# Patient Record
Sex: Female | Born: 1970
Health system: Southern US, Community
[De-identification: ages and names within clinical notes are randomized; demographics above are authoritative.]

## PROBLEM LIST (undated history)

## (undated) DIAGNOSIS — F17200 Nicotine dependence, unspecified, uncomplicated: Secondary | ICD-10-CM

## (undated) DIAGNOSIS — G43909 Migraine, unspecified, not intractable, without status migrainosus: Secondary | ICD-10-CM

## (undated) DIAGNOSIS — Z9181 History of falling: Secondary | ICD-10-CM

## (undated) DIAGNOSIS — F32A Depression, unspecified: Secondary | ICD-10-CM

## (undated) DIAGNOSIS — F329 Major depressive disorder, single episode, unspecified: Secondary | ICD-10-CM

## (undated) DIAGNOSIS — F419 Anxiety disorder, unspecified: Secondary | ICD-10-CM

## (undated) DIAGNOSIS — G1221 Amyotrophic lateral sclerosis: Secondary | ICD-10-CM

## (undated) HISTORY — DX: Nicotine dependence, unspecified, uncomplicated: F17.200

## (undated) HISTORY — DX: Major depressive disorder, single episode, unspecified: F32.9

## (undated) HISTORY — DX: Migraine, unspecified, not intractable, without status migrainosus: G43.909

## (undated) HISTORY — DX: Anxiety disorder, unspecified: F41.9

## (undated) HISTORY — PX: TUBAL LIGATION: SHX77

## (undated) HISTORY — DX: Depression, unspecified: F32.A

## (undated) HISTORY — PX: ABDOMINAL HYSTERECTOMY: SHX81

---

## 1999-12-30 ENCOUNTER — Ambulatory Visit (HOSPITAL_BASED_OUTPATIENT_CLINIC_OR_DEPARTMENT_OTHER): Admission: RE | Admit: 1999-12-30 | Discharge: 1999-12-30 | Payer: Self-pay | Admitting: General Surgery

## 2004-06-04 ENCOUNTER — Encounter: Admission: RE | Admit: 2004-06-04 | Discharge: 2004-06-04 | Payer: Self-pay | Admitting: Family Medicine

## 2004-07-14 ENCOUNTER — Encounter: Admission: RE | Admit: 2004-07-14 | Discharge: 2004-07-14 | Payer: Self-pay | Admitting: Family Medicine

## 2006-05-14 ENCOUNTER — Emergency Department (HOSPITAL_COMMUNITY): Admission: EM | Admit: 2006-05-14 | Discharge: 2006-05-14 | Payer: Self-pay | Admitting: Emergency Medicine

## 2016-08-08 HISTORY — PX: LAPAROSCOPIC NEPHRECTOMY: SUR781

## 2016-08-29 DIAGNOSIS — Z524 Kidney donor: Secondary | ICD-10-CM | POA: Insufficient documentation

## 2016-09-28 DIAGNOSIS — Z905 Acquired absence of kidney: Secondary | ICD-10-CM | POA: Insufficient documentation

## 2018-02-17 ENCOUNTER — Telehealth: Payer: Self-pay | Admitting: Emergency Medicine

## 2018-02-17 ENCOUNTER — Encounter: Payer: Self-pay | Admitting: Emergency Medicine

## 2018-02-17 ENCOUNTER — Emergency Department
Admission: EM | Admit: 2018-02-17 | Discharge: 2018-02-17 | Disposition: A | Discharge status: Home / Self Care | Payer: Self-pay | Source: Home / Self Care

## 2018-02-17 ENCOUNTER — Emergency Department (INDEPENDENT_AMBULATORY_CARE_PROVIDER_SITE_OTHER): Payer: Self-pay

## 2018-02-17 DIAGNOSIS — S93601A Unspecified sprain of right foot, initial encounter: Secondary | ICD-10-CM

## 2018-02-17 DIAGNOSIS — M79671 Pain in right foot: Secondary | ICD-10-CM

## 2018-02-17 MED ORDER — TRAMADOL HCL 50 MG PO TABS: 50.0000 mg | ORAL_TABLET | Freq: Four times a day (QID) | ORAL | 0 refills | Status: DC | PRN

## 2018-02-17 NOTE — ED Triage Notes (Signed)
Pt c/o right foot pain, bruising and swelling after she fell down 2 stairs last night.

## 2018-02-17 NOTE — Discharge Instructions (Addendum)
Elevate foot and apply ice in 20-minute increments as tolerated.  I want you to remain non-weightbearing on the right foot follow-up with sports medicine for further evaluation.  I have prescribed you tramadol as needed for pain please take medication as directed and avoid alcohol or driving while taking medication.

## 2018-02-17 NOTE — ED Provider Notes (Addendum)
[facility]    CSN: [id number] Arrival date & time: 02/17/18  0914     History   Chief Complaint Chief Complaint  Patient presents with  . Foot Pain   HPI Melanie Hogan is a 47 y.o. female for evaluation of right foot swelling and bruising x1 day.  Sting reports that she was walking down a flight of stairs and tripped over the remaining 2 stairs following placing most of her body weight onto her right foot.  Her foot is gradually begin to swell and she has bruising of both ankles bilaterally and at the remaining 3 digits of her right foot.  She reports pain increases with standing improves with nonweightbearing activities. She has not taken any medication as she donated a kidney over a year ago and has been advised by nephrologist not to take any type of NSAIDs. She denies any numbness or paresthesias.  She also denies any prior injuries or surgeries to her right foot or ankle.  OB History   None     Home Medications    Prior to Admission medications   Medication Sig Start Date End Date Taking? Authorizing Provider  traMADol (ULTRAM) 50 MG tablet Take 1-2 tablets (50-100 mg total) by mouth every 6 (six) hours as needed. 02/17/18   Scot Jun, FNP    Family History History reviewed. No pertinent family history.  Social History Social History   Tobacco Use  . Smoking status: Current Every Day Smoker    Packs/day: 0.50    Types: Cigarettes  . Smokeless tobacco: Never Used  Substance Use Topics  . Alcohol use: Not on file  . Drug use: Not on file     Allergies   Patient has no allergy information on record.   Review of Systems Review of Systems Pertinent negatives listed in HPI  Physical Exam Triage Vit  No data found.  Updated Vital Signs BP 112/76 (BP Location: Right Arm)   Pulse (!) 108   Temp 98.2 F (36.8 C) (Oral)   Wt 138 lb (62.6 kg)   SpO2 100%   Visual Acuity Right Eye Distance:   Left Eye Distance:   Bilateral  Distance:    Right Eye Near:   Left Eye Near:    Bilateral Near:     Physical Exam  Constitutional: She appears well-developed and well-nourished. No distress.  Cardiovascular: Normal rate and regular rhythm.  Pulmonary/Chest: Effort normal and breath sounds normal.  Musculoskeletal:       Left foot: There is tenderness, bony tenderness and swelling. There is normal capillary refill.       Feet:   UC Treatments / Results  Labs (all labs ordered are listed, but only abnormal results are displayed) Labs Reviewed - No data to display  EKG None  Radiology Dg Foot Complete Right  Result Date: 02/17/2018 CLINICAL DATA:  Pain after fall. EXAM: RIGHT FOOT COMPLETE - 3+ VIEW COMPARISON:  None. FINDINGS: There is no evidence of fracture or dislocation. There is no evidence of arthropathy or other focal bone abnormality. Soft tissues are unremarkable. IMPRESSION: Negative. Electronically Signed   By: Dorise Bullion III M.D   On: 02/17/2018 10:11    Procedures Procedures (including critical care time)  Medications Ordered in UC Medications - No data to display  Initial Impression / Assessment and Plan / UC Course  I have reviewed the triage vital signs and the nursing notes. Pertinent labs & imaging results that were available during my  care of the patient were reviewed by me and considered in my medical decision making (see chart for details).  Patient presents today with suspected foot fracture after sustaining a fall less than 24 hours ago.  On exam her feet was significant for bruising below the third fourth and fifth digits of the right foot.  She also has bruising and swelling laterally and medial laterally of the right foot.  Imaging was negative of any fracture.  Patient has significant edema of the right foot today which at times can cause fractures to be unseen on x-ray.  Today I will place patient in a postop shoe today opposed to [facility] Walker as she is uninsured and paying  out-of-pocket to conserve expense.  Ace bandage applied to right foot to facilitate compression to reduce swelling. Recommended to ice and foot also to reduce swelling. I am avoiding NSAIDs as patient has recently had a nephrectomy is advised to avoid renal toxic medications.  Have prescribed tramadol for pain relief.  She is to follow-up with [facility] medicine on 02/19/2018 to be further evaluated for foot sprain. Final Clinical Impressions(s) / UC Diagnoses   Final diagnoses:  Sprain of right foot, initial encounter    Discharge Instructions     Elevate foot and apply ice in 20-minute increments as tolerated.  I want you to remain non-weightbearing on the right foot follow-up with sports medicine for further evaluation.  I have prescribed you tramadol as needed for pain please take medication as directed and avoid alcohol or driving while taking medication.    ED Prescriptions    Medication Sig Dispense Auth. Provider   traMADol (ULTRAM) 50 MG tablet Take 1-2 tablets (50-100 mg total) by mouth every 6 (six) hours as needed. 18 tablet Scot Jun, FNP     Controlled Substance Prescriptions [place] Controlled Substance Registry consulted? Yes, I have consulted the [place] Controlled Substances Registry for this patient, and feel the risk/benefit ratio today is favorable for proceeding with this prescription for a controlled substance.   Scot Jun, FNP 02/17/18 [place], [place], FNP 02/17/18 [place], FNP 02/17/18 [phone]

## 2018-02-21 ENCOUNTER — Encounter: Payer: Self-pay | Admitting: Emergency Medicine

## 2018-02-21 ENCOUNTER — Emergency Department (INDEPENDENT_AMBULATORY_CARE_PROVIDER_SITE_OTHER)
Admission: EM | Admit: 2018-02-21 | Discharge: 2018-02-21 | Disposition: A | Discharge status: Home / Self Care | Payer: Self-pay | Source: Home / Self Care | Attending: Family Medicine | Admitting: Family Medicine

## 2018-02-21 ENCOUNTER — Emergency Department (INDEPENDENT_AMBULATORY_CARE_PROVIDER_SITE_OTHER): Payer: Self-pay

## 2018-02-21 ENCOUNTER — Other Ambulatory Visit: Payer: Self-pay

## 2018-02-21 DIAGNOSIS — S92301D Fracture of unspecified metatarsal bone(s), right foot, subsequent encounter for fracture with routine healing: Secondary | ICD-10-CM

## 2018-02-21 DIAGNOSIS — S93334D Other dislocation of right foot, subsequent encounter: Secondary | ICD-10-CM

## 2018-02-21 DIAGNOSIS — X58XXXD Exposure to other specified factors, subsequent encounter: Secondary | ICD-10-CM

## 2018-02-21 MED ORDER — TRAMADOL HCL 50 MG PO TABS: 50.0000 mg | ORAL_TABLET | Freq: Four times a day (QID) | ORAL | 0 refills | Status: DC | PRN

## 2018-02-21 MED ORDER — TRAMADOL HCL 50 MG PO TABS: 50.0000 mg | ORAL_TABLET | Freq: Three times a day (TID) | ORAL | 0 refills | Status: DC | PRN

## 2018-02-21 NOTE — Discharge Instructions (Signed)
°  Tramadol is strong pain medication and is considered a controlled substance.  It can become addictive.  Do not take more than prescribed and do not take more than needed. While taking, do not drink alcohol, drive, or perform any other activities that requires focus while taking these medications.   Please call to schedule a follow up appointment with Sports Medicine in 1-2 weeks if not improving to ensure proper healing of the fracture.

## 2018-02-21 NOTE — ED Triage Notes (Signed)
Pt presents for a follow up on the right foot. She is wearing the walking shoe, elevating and icing with some relief. She states she ran out of pain medications, last night was her last dose.

## 2018-02-21 NOTE — ED Provider Notes (Signed)
[facility]    CSN: [id number] Arrival date & time: 02/21/18  0803     History   Chief Complaint Chief Complaint  Patient presents with  . Foot Pain    Right foot recheck    HPI Melanie Hogan is a 47 y.o. female.   HPI  Melanie Hogan is a 47 y.o. female presenting to UC with c/o continued Right ankle and foot pain that started initially on 02/17/18 after tripping on some stairs.  She was seen at [facility] at that time and had x-rays, which were negative for fracture. She was given a post-op shoe had has been applying ice, ace wrap, and keeping elevated but pain is still present. Pain was aching and sore, 6/10 last night but only 4/10 at this time.  She ran out of her tramadol and is requesting more. She was advised to f/u once the swelling had resolved to make sure there was no occult fracture. No new injuries.  She cannot take NSAIDs due to donating a kidney in 2018 and had been advised by a neurologist 15 years ago to never take acetaminophen due to rebound headaches.    History reviewed. No pertinent past medical history.  There are no active problems to display for this patient.   History reviewed. No pertinent surgical history.  OB History   None      Home Medications    Prior to Admission medications   Medication Sig Start Date End Date Taking? Authorizing Provider  traMADol (ULTRAM) 50 MG tablet Take 1 tablet (50 mg total) by mouth every 8 (eight) hours as needed for moderate pain or severe pain. 02/21/18   Noe Gens, PA-C    Family History History reviewed. No pertinent family history.  Social History Social History   Tobacco Use  . Smoking status: Current Every Day Smoker    Packs/day: 0.50    Types: Cigarettes  . Smokeless tobacco: Never Used  Substance Use Topics  . Alcohol use: Not Currently  . Drug use: Not Currently     Allergies   Patient has no allergy information on record.   Review of Systems Review of Systems    Musculoskeletal: Positive for arthralgias, gait problem and myalgias. Negative for joint swelling.  Skin: Positive for color change. Negative for wound.     Physical Exam Triage Vital Signs ED Triage Vitals  Enc Vitals Group     BP 02/21/18 0812 112/77     Pulse Rate 02/21/18 0812 93     Resp --      Temp 02/21/18 0812 98.1 F (36.7 C)     Temp Source 02/21/18 0812 Oral     SpO2 02/21/18 0812 99 %     Weight --      Height --      Head Circumference --      Peak Flow --      Pain Score 02/21/18 0813 4     Pain Loc --      Pain Edu? --      Excl. in [place]? --    No data found.  Updated Vital Signs BP 112/77   Pulse 93   Temp 98.1 F (36.7 C) (Oral)   SpO2 99%   Visual Acuity Right Eye Distance:   Left Eye Distance:   Bilateral Distance:    Right Eye Near:   Left Eye Near:    Bilateral Near:     Physical Exam  Constitutional: She is  oriented to person, place, and time. She appears well-developed and well-nourished. No distress.  HENT:  Head: Normocephalic and atraumatic.  Eyes: EOM are normal.  Neck: Normal range of motion.  Cardiovascular: Normal rate.  Pulmonary/Chest: Effort normal.  Musculoskeletal: She exhibits edema and tenderness.  Right ankle and foot: minimal edema. Slight decreased dorsiflexion due to pain. Mild tenderness to medial aspect proximal foot. No crepitus. Full ROM toes, non-tender Calf is soft, non-tender.  Neurological: She is alert and oriented to person, place, and time.  Skin: Skin is warm and dry. She is not diaphoretic.  Right ankle and foot: skin in tact. Diffuse ecchymosis to ankle and toes. No erythema or warmth.  Psychiatric: She has a normal mood and affect. Her behavior is normal.  Nursing note and vitals reviewed.    UC Treatments / Results  Labs (all labs ordered are listed, but only abnormal results are displayed) Labs Reviewed - No data to display  EKG None  Radiology Dg Foot Complete Right  Result Date:  02/21/2018 CLINICAL DATA:  Recent inversion injury with swelling EXAM: RIGHT FOOT COMPLETE - 3+ VIEW COMPARISON:  February 17, 2018 FINDINGS: Frontal, oblique, and lateral views were obtained. There is a small avulsion arising from the dorsal proximal navicular. No other evident fracture. No dislocation. Joint spaces appear normal. No erosive change. IMPRESSION: Small avulsion arising from the dorsal proximal navicular. No other evident fracture. No dislocation. No appreciable arthropathy. These results will be called to the ordering clinician or representative by the Radiologist Assistant, and communication documented in the PACS or zVision Dashboard. Electronically Signed   By: Lowella Grip III M.D.   On: 02/21/2018 08:56    Procedures Procedures (including critical care time)  Medications Ordered in UC Medications - No data to display  Initial Impression / Assessment and Plan / UC Course  I have reviewed the triage vital signs and the nursing notes.  Pertinent labs & imaging results that were available during my care of the patient were reviewed by me and considered in my medical decision making (see chart for details).     Imaging shows avulsion fracture of dorsal proximal navicular. Although not stated on her last x-ray on 02/17/18, same bone abnormality can be seen there too. Pt may continue post-op shoe, ace wrap, elevate and tramadol  Final Clinical Impressions(s) / UC Diagnoses   Final diagnoses:  Closed avulsion fracture of metatarsal bone of right foot with routine healing, subsequent encounter     Discharge Instructions      Tramadol is strong pain medication and is considered a controlled substance.  It can become addictive.  Do not take more than prescribed and do not take more than needed. While taking, do not drink alcohol, drive, or perform any other activities that requires focus while taking these medications.   Please call to schedule a follow up appointment with  Sports Medicine in 1-2 weeks if not improving to ensure proper healing of the fracture.     ED Prescriptions    Medication Sig Dispense Auth. Provider   traMADol (ULTRAM) 50 MG tablet  (Status: Discontinued) Take 1 tablet (50 mg total) by mouth every 6 (six) hours as needed for moderate pain or severe pain. 10 tablet Leeroy Cha O, PA-C   traMADol (ULTRAM) 50 MG tablet Take 1 tablet (50 mg total) by mouth every 8 (eight) hours as needed for moderate pain or severe pain. 10 tablet Noe Gens, PA-C     Controlled Substance Prescriptions  [place] Controlled Substance Registry consulted? Yes, I have consulted the [place] Controlled Substances Registry for this patient, and feel the risk/benefit ratio today is favorable for proceeding with this prescription for a controlled substance.   Noe Gens, [place] 02/21/18 1029

## 2018-08-27 DIAGNOSIS — M79641 Pain in right hand: Secondary | ICD-10-CM | POA: Diagnosis not present

## 2018-09-05 ENCOUNTER — Ambulatory Visit (INDEPENDENT_AMBULATORY_CARE_PROVIDER_SITE_OTHER): Payer: BLUE CROSS/BLUE SHIELD | Admitting: Physician Assistant

## 2018-09-05 ENCOUNTER — Encounter: Payer: Self-pay | Admitting: Physician Assistant

## 2018-09-05 VITALS — BP 115/79 | HR 72 | Ht 67.0 in | Wt 139.0 lb

## 2018-09-05 DIAGNOSIS — Z7689 Persons encountering health services in other specified circumstances: Secondary | ICD-10-CM

## 2018-09-05 DIAGNOSIS — N289 Disorder of kidney and ureter, unspecified: Secondary | ICD-10-CM

## 2018-09-05 DIAGNOSIS — Z905 Acquired absence of kidney: Secondary | ICD-10-CM | POA: Diagnosis not present

## 2018-09-05 DIAGNOSIS — F17200 Nicotine dependence, unspecified, uncomplicated: Secondary | ICD-10-CM

## 2018-09-05 DIAGNOSIS — F411 Generalized anxiety disorder: Secondary | ICD-10-CM | POA: Insufficient documentation

## 2018-09-05 DIAGNOSIS — Z1231 Encounter for screening mammogram for malignant neoplasm of breast: Secondary | ICD-10-CM | POA: Diagnosis not present

## 2018-09-05 DIAGNOSIS — F331 Major depressive disorder, recurrent, moderate: Secondary | ICD-10-CM | POA: Diagnosis not present

## 2018-09-05 DIAGNOSIS — G43009 Migraine without aura, not intractable, without status migrainosus: Secondary | ICD-10-CM

## 2018-09-05 MED ORDER — TRAZODONE HCL 50 MG PO TABS: 25.0000 mg | ORAL_TABLET | Freq: Every evening | ORAL | 2 refills | Status: DC | PRN

## 2018-09-05 MED ORDER — RIZATRIPTAN BENZOATE 10 MG PO TBDP: 10.0000 mg | ORAL_TABLET | ORAL | 0 refills | Status: DC | PRN

## 2018-09-05 MED ORDER — ESCITALOPRAM OXALATE 20 MG PO TABS
ORAL_TABLET | ORAL | 1 refills | Status: DC
Start: 2018-09-05 — End: 2018-10-03

## 2018-09-05 MED ORDER — ALPRAZOLAM 0.5 MG PO TABS: 0.5000 mg | ORAL_TABLET | Freq: Once | ORAL | 0 refills | Status: DC | PRN

## 2018-09-05 NOTE — Progress Notes (Signed)
HPI:                                                                Melanie Hogan is a 48 y.o. female who presents to [place]: Primary Care Sports Medicine today to establish care  Current concerns:  S/p laparoscopic left donor nephrectomy Jan 2018. Last Scr 1.13, GFR 59 in Nov 2018. She has a follow-up appointment with nephrology at [facility] on Monday.  She states she is overdue for her annual follow-up.  Depression/Anxiety: reports feeling very overwhelmed with multiple stressors.  She recently found out from her employer that she may be laid off on Friday.  Lives at home with great nephew who is 61 years old, who has behavioral issues.  She is his primary legal guardian. She is experiencing headaches, loss of appetite, weight loss, panic attacks.  States "I feel empty.  I have nothing else to give."  She wants to feel better and provide for her nephew. Denies symptoms of mania/hypomania. Denies suicidal thinking. Denies auditory/visual hallucinations. Prior medications included Lexapro, Xanax and Trazodone Denies illicit drug or alcohol use.  Smokes approximately 1/2 pack/day.  Declined smoking cessation.  Headaches: reports 3 headache days last week and 1 severe headache this week.  Headaches are not as severe as her typical migraine headache.  Headache is described as throbbing and worse with position changes.  She has a history of migraine headaches and was evaluated by a neurologist 15 years ago.  History of normal CT head with and without contrast in 2005. She is not currently taking anything for her headaches due to fear of damaging her kidneys  Depression screen [facility] 2/9 09/05/2018  Decreased Interest 2  Down, Depressed, Hopeless 3  PHQ - 2 Score 5  Altered sleeping 3  Tired, decreased energy 3  Change in appetite 3  Feeling bad or failure about yourself  2  Trouble concentrating 2  Moving slowly or fidgety/restless 1  Suicidal thoughts 0  PHQ-9  Score 19    GAD 7 : Generalized Anxiety Score 09/05/2018  Nervous, Anxious, on Edge 3  Control/stop worrying 3  Worry too much - different things 3  Trouble relaxing 3  Restless 3  Easily annoyed or irritable 3  Afraid - awful might happen 3  Total GAD 7 Score 21      Past Medical History:  Diagnosis Date  . Anxiety   . Depression   . Tobacco use disorder    Past Surgical History:  Procedure Laterality Date  . ABDOMINAL HYSTERECTOMY    . LAPAROSCOPIC NEPHRECTOMY Left 08/2016   donor  . TUBAL LIGATION     Social History   Tobacco Use  . Smoking status: Current Every Day Smoker    Packs/day: 0.50    Years: 20.00    Pack years: 10.00    Types: Cigarettes  . Smokeless tobacco: Never Used  Substance Use Topics  . Alcohol use: Not Currently    Comment: 1-2 / year   family history is not on file.    ROS: Review of Systems  Constitutional:       + decreased appetite  Psychiatric/Behavioral: Positive for depression. Negative for substance abuse and suicidal ideas. The patient is nervous/anxious and has insomnia.  All other systems reviewed and are negative.    Medications: No current outpatient medications on file.   No current facility-administered medications for this visit.    Allergies  Allergen Reactions  . Morphine And Related        Objective:  BP 115/79   Pulse 72   Ht 5\' 7"  (1.702 m)   Wt 139 lb (63 kg)   BMI 21.77 kg/m  Gen:  alert, not ill-appearing, no distress, appropriate for age 50: head normocephalic without obvious abnormality, conjunctiva and cornea clear, wearing glasses trachea midline Pulm: Normal work of breathing, normal phonation Neuro: alert and oriented x 3, no tremor MSK: extremities atraumatic, normal gait and station Skin: intact, no rashes on exposed skin, no jaundice, no cyanosis Psych: well-groomed, cooperative, good eye contact, depressed mood, affect mood-congruent, she is tearful, speech is articulate, and  thought processes clear and goal-directed, good insight, normal judgment, no suicidal ideation    No results found for this or any previous visit (from the past 72 hour(s)). No results found.    Assessment and Plan: 48 y.o. female with   .Melanie Hogan was seen today for establish care.  Diagnoses and all orders for this visit:  Encounter to establish care  Breast cancer screening by mammogram -     MM 3D SCREEN BREAST BILATERAL; Future  GAD (generalized anxiety disorder) -     escitalopram (LEXAPRO) 20 MG tablet; One half tab daily for a week then one tab by mouth daily -     traZODone (DESYREL) 50 MG tablet; Take 0.5-1 tablets (25-50 mg total) by mouth at bedtime as needed for sleep. -     ALPRAZolam (XANAX) 0.5 MG tablet; Take 1 tablet (0.5 mg total) by mouth once as needed for up to 1 dose for anxiety (panic).  Moderate episode of recurrent major depressive disorder (HCC) -     escitalopram (LEXAPRO) 20 MG tablet; One half tab daily for a week then one tab by mouth daily  Tobacco use disorder  Migraine without aura and without status migrainosus, not intractable -     rizatriptan (MAXALT-MLT) 10 MG disintegrating tablet; Take 1 tablet (10 mg total) by mouth as needed for migraine. May repeat in 2 hours if needed  S/p nephrectomy -     Renal Profile with Estimated GFR -     CBC -     Urinalysis, Routine w reflex microscopic -     Urine Microalbumin w/creat. ratio  Renal insufficiency -     Renal Profile with Estimated GFR -     CBC -     Urinalysis, Routine w reflex microscopic -     Urine Microalbumin w/creat. ratio   - Personally reviewed PMH, PSH, PFH, medications, allergies, HM - Age-appropriate cancer screening: Overdue for Pap smear and mammogram: Mammogram ordered today, she will need to schedule annual physical exam with Pap smear - Influenza, Pneumovax and Tdap declined  It has been over a year since her kidney function was monitored.  She may lose her  insurance as early as this Friday so I will order all of her labs for her nephrologist today.   Major depression, generalized anxiety disorder, acute stress reaction PHQ 9 equals 19, no acute safety issues Gad 7 equals 21, severe Restarting Lexapro self titrate to 20 mg daily Restart trazodone 25 mg nightly, may increase to 50 mg nightly after 3 nights if needed Restart alprazolam half a milligram once daily as needed for severe anxiety and  panic attacks We discussed that there is no renal dosing as far as trazodone or alprazolam are concerned, but we should keep a close eye on her kidney function while she is taking long-term medications at least every 3 months in the beginning and every 6 months thereafter Provided with resources on local and online counseling options Follow-up in 1 month  Migraine headaches No change from prior headache pattern, but increased frequency in the setting of increased stress and anxiety Avoiding NSAIDs due to renal insufficiency Trial of Maxalt 10 mg as needed Headache diary Patient counseled on general measures for headache//migraine.  Declined smoking cessation  Patient education and anticipatory guidance given Patient agrees with treatment plan Follow-up in 1 month or sooner as needed if symptoms worsen or fail to improve  Darlyne Russian PA-C

## 2018-09-05 NOTE — Patient Instructions (Addendum)
For your migraines: -Keep track of your headaches with a headache diary.  Log the dates of your headache, how long they lasted, severity and what you took and if it was effective - take Drucilla Schmidt triptan dissolved under the tongue at the first sign of migraine for abortive therapy - may repeat once after 2 hours if headache persists - do not use more than twice in 24 hours. And try not to use abortive medication more than 6 times per month -Aim to get at least 7 to 9 hours of sleep per night - drink at least 64 ounces of water per day -Avoid all caffeine or at least limit to one standard caffeinated beverage per day - avoid alcohol  For anxiety and depression: Counseling: - psychologytoday.com: search engine to locate local counselors - Family Services in your county offer counseling on a sliding scale (pay what you can afford) - [place]: we can place a referral for you to see one of licensed counselors in [place], [place], or [place] - online counseling: Chartered loss adjuster (not covered by insurance, but affordable self-pay rates)  Other resources: Best Overall for Online Counseling: Talkspace "Talkspace's unlimited messaging and video conferencing makes it a convenient app for addressing a variety of needs."  Best Live Chat Sessions: Betterhelp "Betterhelp allows a variety of ways to contact your therapist, including live chat sessions."  Best for Couples: [facility] "[facility] specializes in couples therapy and they offer a variety of services to help address relationship issues."  Best for a Quick Consultation: Amwell "Amwell offers access to a variety of mental health professionals any time of day or night."  Best for Peer Support: 7 Cups of Tea "You can access free support from peers on 7 Cups of Tea or you can choose to pay to speak to a professional."  Best for Teens: Teen Counseling "Teens can chat, message, speak over the phone, or video  conference with a therapist who has experience treating their age group."  Best for LGBTQ: [facility] "[facility] offers online therapy to individuals in the [place] community, and their goal is to offer discreet, affordable, and accessible treatment."  Best Free Assessment: Doctor on Demand "Doctor on Demand provides access to a free assessment that can help you determine if you should talk to someone about anxiety or depression."  Best for Access to a Psychiatrist: MDL[facility] "MDLive provides access to psychiatrists who can prescribe and manage psychiatric medications."  Safety Plan: if having self-harm or suicidal thoughts Our Office [place] 1-800-SUICIDE If in immediate danger of harming yourself, go to the nearest emergency room or call 911

## 2018-09-06 LAB — RENAL PROFILE WITH ESTIMATED GFR
Albumin: 4.5 g/dL (ref 3.6–5.1)
BUN/Creatinine Ratio: 11 (calc) (ref 6–22)
BUN: 12 mg/dL (ref 7–25)
CO2: 25 mmol/L (ref 20–32)
Calcium: 9.7 mg/dL (ref 8.6–10.2)
Chloride: 107 mmol/L (ref 98–110)
Creat: 1.11 mg/dL — ABNORMAL HIGH (ref 0.50–1.10)
GFR, Est African American: 69 mL/min/{1.73_m2} (ref >=60)
GFR, Est Non African American: 60 mL/min/{1.73_m2} (ref >=60)
Glucose, Bld: 84 mg/dL (ref 65–99)
Phosphorus: 3.6 mg/dL (ref 2.5–4.5)
Potassium: 4.6 mmol/L (ref 3.5–5.3)
Sodium: 140 mmol/L (ref 135–146)

## 2018-09-06 LAB — CBC
HCT: 41.3 % (ref 35.0–45.0)
Hemoglobin: 14 g/dL (ref 11.7–15.5)
MCH: 30.8 pg (ref 27.0–33.0)
MCHC: 33.9 g/dL (ref 32.0–36.0)
MCV: 91 fL (ref 80.0–100.0)
MPV: 11.8 fL (ref 7.5–12.5)
Platelets: 258 10*3/uL (ref 140–400)
RBC: 4.54 10*6/uL (ref 3.80–5.10)
RDW: 12.8 % (ref 11.0–15.0)
WBC: 7.9 10*3/uL (ref 3.8–10.8)

## 2018-09-06 LAB — MICROALBUMIN / CREATININE URINE RATIO
Creatinine, Urine: 44 mg/dL (ref 20–275)
Microalb, Ur: <0.2 mg/dL

## 2018-09-06 LAB — URINALYSIS, ROUTINE W REFLEX MICROSCOPIC
Bilirubin Urine: NEGATIVE
Glucose, UA: NEGATIVE
Hgb urine dipstick: NEGATIVE
Ketones, ur: NEGATIVE
Leukocytes, UA: NEGATIVE
Nitrite: NEGATIVE
Protein, ur: NEGATIVE
Specific Gravity, Urine: 1.007 (ref 1.001–1.03)
pH: 5.5 (ref 5.0–8.0)

## 2018-09-07 NOTE — Progress Notes (Signed)
Labs show mild renal insufficiency.  This is stable compared to kidney function completed postoperatively at [facility] to continue current medications Continue to use caution with over-the-counter medicines and avoid NSAIDs We will route these lab results to nephrologist at [facility]

## 2018-10-03 ENCOUNTER — Ambulatory Visit (INDEPENDENT_AMBULATORY_CARE_PROVIDER_SITE_OTHER): Payer: BLUE CROSS/BLUE SHIELD | Admitting: Physician Assistant

## 2018-10-03 ENCOUNTER — Ambulatory Visit (INDEPENDENT_AMBULATORY_CARE_PROVIDER_SITE_OTHER): Payer: BLUE CROSS/BLUE SHIELD

## 2018-10-03 ENCOUNTER — Encounter: Payer: Self-pay | Admitting: Physician Assistant

## 2018-10-03 DIAGNOSIS — Z1231 Encounter for screening mammogram for malignant neoplasm of breast: Secondary | ICD-10-CM

## 2018-10-03 DIAGNOSIS — F331 Major depressive disorder, recurrent, moderate: Secondary | ICD-10-CM

## 2018-10-03 DIAGNOSIS — R928 Other abnormal and inconclusive findings on diagnostic imaging of breast: Secondary | ICD-10-CM | POA: Diagnosis not present

## 2018-10-03 DIAGNOSIS — F411 Generalized anxiety disorder: Secondary | ICD-10-CM | POA: Diagnosis not present

## 2018-10-03 MED ORDER — ALPRAZOLAM 0.5 MG PO TABS: 0.5000 mg | ORAL_TABLET | Freq: Once | ORAL | 1 refills | Status: DC | PRN

## 2018-10-03 MED ORDER — ESCITALOPRAM OXALATE 20 MG PO TABS: 20.0000 mg | ORAL_TABLET | Freq: Every day | ORAL | 0 refills | Status: DC

## 2018-10-03 MED ORDER — TRAZODONE HCL 100 MG PO TABS: 100.0000 mg | ORAL_TABLET | Freq: Every evening | ORAL | 0 refills | Status: DC | PRN

## 2018-10-03 NOTE — Progress Notes (Signed)
HPI:                                                                Melanie Hogan is a 48 y.o. female who presents to [facility] today to establish care  Current concerns:  Depression/Anxiety: reports feeling very overwhelmed with multiple stressors.  She recently found out from her employer that she may be laid off on Friday.  Lives at home with great nephew who is 25 years old, who has behavioral issues.  She is his primary legal guardian. She is experiencing headaches, loss of appetite, weight loss, panic attacks.  States "I feel empty.  I have nothing else to give."  She wants to feel better and provide for her nephew. Denies symptoms of mania/hypomania. Denies suicidal thinking. Denies auditory/visual hallucinations. Prior medications included Lexapro, Xanax and Trazodone Denies illicit drug or alcohol use.  Smokes approximately 1/2 pack/day.  Declined smoking cessation.  Headaches: reports 3 headache days last week and 1 severe headache this week.  Headaches are not as severe as her typical migraine headache.  Headache is described as throbbing and worse with position changes.  She has a history of migraine headaches and was evaluated by a neurologist 15 years ago.  History of normal CT head with and without contrast in 2005. She is not currently taking anything for her headaches due to fear of damaging her kidneys  Interval history 10/03/18 - feeling significantly improved since last OV. States "I'm 150 degrees better, not quite 180." - mood is good - sleep: restorative, taking Trazodone 100 mg QHS - has only had 1 mild headache since last month and did not need to take Maxalt for it - denies weight loss, appetite is improved - she does report stressors are still present; she is being laid off this Friday and had to put her dog down on 09/21/18  Depression screen [facility] 2/9 10/03/2018 09/05/2018  Decreased Interest 0 2  Down, Depressed,  Hopeless 0 3  PHQ - 2 Score 0 5  Altered sleeping 2 3  Tired, decreased energy 1 3  Change in appetite 1 3  Feeling bad or failure about yourself  0 2  Trouble concentrating 0 2  Moving slowly or fidgety/restless 0 1  Suicidal thoughts 0 0  PHQ-9 Score 4 19    GAD 7 : Generalized Anxiety Score 10/03/2018 09/05/2018  Nervous, Anxious, on Edge 1 3  Control/stop worrying 1 3  Worry too much - different things 1 3  Trouble relaxing 1 3  Restless 0 3  Easily annoyed or irritable 1 3  Afraid - awful might happen 0 3  Total GAD 7 Score 5 21      Past Medical History:  Diagnosis Date  . Anxiety   . Depression   . Migraines   . Tobacco use disorder    Past Surgical History:  Procedure Laterality Date  . ABDOMINAL HYSTERECTOMY    . LAPAROSCOPIC NEPHRECTOMY Left 08/2016   donor  . TUBAL LIGATION     Social History   Tobacco Use  . Smoking status: Current Every Day Smoker    Packs/day: 0.50    Years: 20.00    Pack years: 10.00    Types: Cigarettes  .  Smokeless tobacco: Never Used  Substance Use Topics  . Alcohol use: Not Currently    Comment: 1-2 / year   family history is not on file.    ROS: Review of Systems  Constitutional: Negative for weight loss.  Cardiovascular: Negative for chest pain.  Psychiatric/Behavioral: Positive for depression. Negative for substance abuse and suicidal ideas. The patient is nervous/anxious and has insomnia.   All other systems reviewed and are negative.    Medications: Current Outpatient Medications  Medication Sig Dispense Refill  . ALPRAZolam (XANAX) 0.5 MG tablet Take 1 tablet (0.5 mg total) by mouth once as needed for up to 1 dose for anxiety (panic). 20 tablet 1  . escitalopram (LEXAPRO) 20 MG tablet Take 1 tablet (20 mg total) by mouth at bedtime. 90 tablet 0  . rizatriptan (MAXALT-MLT) 10 MG disintegrating tablet Take 1 tablet (10 mg total) by mouth as needed for migraine. May repeat in 2 hours if needed 6 tablet 0  .  traZODone (DESYREL) 100 MG tablet Take 1 tablet (100 mg total) by mouth at bedtime as needed for sleep. 90 tablet 0   No current facility-administered medications for this visit.    Allergies  Allergen Reactions  . Morphine And Related        Objective:  BP 108/72   Pulse 60   Wt 138 lb (62.6 kg)   BMI 21.61 kg/m  Gen:  alert, not ill-appearing, no distress, appropriate for age 30: head normocephalic without obvious abnormality, conjunctiva and cornea clear, wearing glasses trachea midline Pulm: Normal work of breathing, normal phonation Neuro: alert and oriented x 3, no tremor MSK: extremities atraumatic, normal gait and station Skin: intact, no rashes on exposed skin, no jaundice, no cyanosis Psych: well-groomed, cooperative, good eye contact, euthymic mood, affect mood-congruent, no psychomotor agitation, speech is articulate, and thought processes clear and goal-directed, good insight, normal judgment, no suicidal ideation  Wt Readings from Last 3 Encounters:  10/03/18 138 lb (62.6 kg)  09/05/18 139 lb (63 kg)  02/17/18 138 lb (62.6 kg)     No results found for this or any previous visit (from the past 72 hour(s)). No results found.    Assessment and Plan: 48 y.o. female with   .Diagnoses and all orders for this visit:  GAD (generalized anxiety disorder) -     ALPRAZolam (XANAX) 0.5 MG tablet; Take 1 tablet (0.5 mg total) by mouth once as needed for up to 1 dose for anxiety (panic). -     escitalopram (LEXAPRO) 20 MG tablet; Take 1 tablet (20 mg total) by mouth at bedtime. -     traZODone (DESYREL) 100 MG tablet; Take 1 tablet (100 mg total) by mouth at bedtime as needed for sleep.  Moderate episode of recurrent major depressive disorder (HCC) -     escitalopram (LEXAPRO) 20 MG tablet; Take 1 tablet (20 mg total) by mouth at bedtime.    Major depression, generalized anxiety disorder, acute stress reaction PHQ 9 equals 4, improved from 19 Gad 7 equals 5,  improved from  21 Partial response to Lexapro. Cont 20 mg daily Self-titrated to 100 mg of Trazodone. We will continue at this dose Using Alprazolam less than once daily. Refill provided  Migraine headaches Avoiding NSAIDs due to renal insufficiency Recommended trying 5 mg of Maxalt initially and increasing to 10 mg if needed Headache diary Patient counseled on general measures for headache//migraine.  Declined smoking cessation  Patient education and anticipatory guidance given Patient agrees with  treatment plan Follow-up in 3 months or sooner as needed if symptoms worsen or fail to improve  Darlyne Russian PA-C

## 2018-10-03 NOTE — Patient Instructions (Signed)
Living With Anxiety    After being diagnosed with an anxiety disorder, you may be relieved to know why you have felt or behaved a certain way. It is natural to also feel overwhelmed about the treatment ahead and what it will mean for your life. With care and support, you can manage this condition and recover from it.  How to cope with anxiety  Dealing with stress  Stress is your body’s reaction to life changes and events, both good and bad. Stress can last just a few hours or it can be ongoing. Stress can play a major role in anxiety, so it is important to learn both how to cope with stress and how to think about it differently.  Talk with your health care provider or a counselor to learn more about stress reduction. He or she may suggest some stress reduction techniques, such as:  · Music therapy. This can include creating or listening to music that you enjoy and that inspires you.  · Mindfulness-based meditation. This involves being aware of your normal breaths, rather than trying to control your breathing. It can be done while sitting or walking.  · Centering prayer. This is a kind of meditation that involves focusing on a word, phrase, or sacred image that is meaningful to you and that brings you peace.  · Deep breathing. To do this, expand your stomach and inhale slowly through your nose. Hold your breath for 3-5 seconds. Then exhale slowly, allowing your stomach muscles to relax.  · Self-talk. This is a skill where you identify thought patterns that lead to anxiety reactions and correct those thoughts.  · Muscle relaxation. This involves tensing muscles then relaxing them.  Choose a stress reduction technique that fits your lifestyle and personality. Stress reduction techniques take time and practice. Set aside 5-15 minutes a day to do them. Therapists can offer training in these techniques. The training may be covered by some insurance plans. Other things you can do to manage stress include:  · Keeping a  stress diary. This can help you learn what triggers your stress and ways to control your response.  · Thinking about how you respond to certain situations. You may not be able to control everything, but you can control your reaction.  · Making time for activities that help you relax, and not feeling guilty about spending your time in this way.  Therapy combined with coping and stress-reduction skills provides the best chance for successful treatment.  Medicines  Medicines can help ease symptoms. Medicines for anxiety include:  · Anti-anxiety drugs.  · Antidepressants.  · Beta-blockers.  Medicines may be used as the main treatment for anxiety disorder, along with therapy, or if other treatments are not working. Medicines should be prescribed by a health care provider.  Relationships  Relationships can play a big part in helping you recover. Try to spend more time connecting with trusted friends and family members. Consider going to couples counseling, taking family education classes, or going to family therapy. Therapy can help you and others better understand the condition.  How to recognize changes in your condition  Everyone has a different response to treatment for anxiety. Recovery from anxiety happens when symptoms decrease and stop interfering with your daily activities at home or work. This may mean that you will start to:  · Have better concentration and focus.  · Sleep better.  · Be less irritable.  · Have more energy.  · Have improved memory.  It is   important to recognize when your condition is getting worse. Contact your health care provider if your symptoms interfere with home or work and you do not feel like your condition is improving.  Where to find help and support:  You can get help and support from these sources:  · Self-help groups.  · Online and community organizations.  · A trusted spiritual leader.  · Couples counseling.  · Family education classes.  · Family therapy.  Follow these instructions  at home:  · Eat a healthy diet that includes plenty of vegetables, fruits, whole grains, low-fat dairy products, and lean protein. Do not eat a lot of foods that are high in solid fats, added sugars, or salt.  · Exercise. Most adults should do the following:  ? Exercise for at least 150 minutes each week. The exercise should increase your heart rate and make you sweat (moderate-intensity exercise).  ? Strengthening exercises at least twice a week.  · Cut down on caffeine, tobacco, alcohol, and other potentially harmful substances.  · Get the right amount and quality of sleep. Most adults need 7-9 hours of sleep each night.  · Make choices that simplify your life.  · Take over-the-counter and prescription medicines only as told by your health care provider.  · Avoid caffeine, alcohol, and certain over-the-counter cold medicines. These may make you feel worse. Ask your pharmacist which medicines to avoid.  · Keep all follow-up visits as told by your health care provider. This is important.  Questions to ask your health care provider  · Would I benefit from therapy?  · How often should I follow up with a health care provider?  · How long do I need to take medicine?  · Are there any long-term side effects of my medicine?  · Are there any alternatives to taking medicine?  Contact a health care provider if:  · You have a hard time staying focused or finishing daily tasks.  · You spend many hours a day feeling worried about everyday life.  · You become exhausted by worry.  · You start to have headaches, feel tense, or have nausea.  · You urinate more than normal.  · You have diarrhea.  Get help right away if:  · You have a racing heart and shortness of breath.  · You have thoughts of hurting yourself or others.  If you ever feel like you may hurt yourself or others, or have thoughts about taking your own life, get help right away. You can go to your nearest emergency department or call:  · Your local emergency services  (911 in the [place]  · A suicide crisis helpline, such as the National Suicide Prevention Lifeline at 1-800-273-8255. This is open 24-hours a day.  Summary  · Taking steps to deal with stress can help calm you.  · Medicines cannot cure anxiety disorders, but they can help ease symptoms.  · Family, friends, and partners can play a big part in helping you recover from an anxiety disorder.  This information is not intended to replace advice given to you by your health care provider. Make sure you discuss any questions you have with your health care provider.  Document Released: 07/19/2016 Document Revised: 07/19/2016 Document Reviewed: 07/19/2016  Elsevier Interactive Patient Education © 2019 [employer]

## 2018-10-10 ENCOUNTER — Other Ambulatory Visit: Payer: Self-pay | Admitting: Physician Assistant

## 2018-10-10 DIAGNOSIS — R928 Other abnormal and inconclusive findings on diagnostic imaging of breast: Secondary | ICD-10-CM

## 2018-10-11 ENCOUNTER — Ambulatory Visit
Admission: RE | Admit: 2018-10-11 | Discharge: 2018-10-11 | Disposition: A | Discharge status: Home / Self Care | Payer: BLUE CROSS/BLUE SHIELD | Source: Ambulatory Visit | Attending: Physician Assistant | Admitting: Physician Assistant

## 2018-10-11 DIAGNOSIS — N6001 Solitary cyst of right breast: Secondary | ICD-10-CM | POA: Diagnosis not present

## 2018-10-11 DIAGNOSIS — R922 Inconclusive mammogram: Secondary | ICD-10-CM | POA: Diagnosis not present

## 2018-10-11 DIAGNOSIS — R928 Other abnormal and inconclusive findings on diagnostic imaging of breast: Secondary | ICD-10-CM

## 2018-10-12 ENCOUNTER — Encounter: Payer: Self-pay | Admitting: Physician Assistant

## 2018-10-12 DIAGNOSIS — N6001 Solitary cyst of right breast: Secondary | ICD-10-CM | POA: Insufficient documentation

## 2019-01-01 ENCOUNTER — Ambulatory Visit: Payer: BLUE CROSS/BLUE SHIELD | Admitting: Physician Assistant

## 2019-01-02 ENCOUNTER — Encounter: Payer: Self-pay | Admitting: Physician Assistant

## 2019-01-02 ENCOUNTER — Ambulatory Visit (INDEPENDENT_AMBULATORY_CARE_PROVIDER_SITE_OTHER): Payer: BLUE CROSS/BLUE SHIELD | Admitting: Physician Assistant

## 2019-01-02 VITALS — Temp 98.7°F | Wt 136.0 lb

## 2019-01-02 DIAGNOSIS — F411 Generalized anxiety disorder: Secondary | ICD-10-CM

## 2019-01-02 DIAGNOSIS — F331 Major depressive disorder, recurrent, moderate: Secondary | ICD-10-CM | POA: Diagnosis not present

## 2019-01-02 DIAGNOSIS — G43009 Migraine without aura, not intractable, without status migrainosus: Secondary | ICD-10-CM | POA: Diagnosis not present

## 2019-01-02 MED ORDER — ESCITALOPRAM OXALATE 20 MG PO TABS: 20.0000 mg | ORAL_TABLET | Freq: Every day | ORAL | 0 refills | Status: DC

## 2019-01-02 MED ORDER — TRAZODONE HCL 150 MG PO TABS: 150.0000 mg | ORAL_TABLET | Freq: Every evening | ORAL | 0 refills | Status: DC | PRN

## 2019-01-02 MED ORDER — ELETRIPTAN HYDROBROMIDE 40 MG PO TABS: 40.0000 mg | ORAL_TABLET | ORAL | 0 refills | Status: DC | PRN

## 2019-01-02 MED ORDER — ALPRAZOLAM 1 MG PO TABS: 1.0000 mg | ORAL_TABLET | Freq: Once | ORAL | 2 refills | Status: DC | PRN

## 2019-01-02 NOTE — Progress Notes (Signed)
Virtual Visit via Video Note  I connected with Melanie Hogan on 01/02/19 at  8:10 AM EDT by a video enabled telemedicine application and verified that I am speaking with the correct person using two identifiers.   I discussed the limitations of evaluation and management by telemedicine and the availability of in person appointments. The patient expressed understanding and agreed to proceed.  History of Present Illness: HPI:                                                                Melanie Hogan is a 48 y.o. female   CC: anxiety and depression f/u   Sleep - having multiple nighttime awakenings 3-5 times 3-4 nights per week. Taking Trazodone 100 mg nightly Anxiety - increased. Feels overwhelmed. 1 panic attack, 1 week ago. 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 She took single 10 mg dose and states it knocked her out. This has been her only migraine in the last 4 months.  Depression screen [facility] 2/9 01/02/2019 10/03/2018 09/05/2018  Decreased Interest 1 0 2  Down, Depressed, Hopeless 2 0 3  PHQ - 2 Score 3 0 5  Altered sleeping 3 2 3   Tired, decreased energy 1 1 3   Change in appetite 2 1 3   Feeling bad or failure about yourself  0 0 2  Trouble concentrating 1 0 2  Moving slowly or fidgety/restless 0 0 1  Suicidal thoughts 0 0 0  PHQ-9 Score 10 4 19     GAD 7 : Generalized Anxiety Score 01/02/2019 10/03/2018 09/05/2018  Nervous, Anxious, on Edge 2 1 3   Control/stop worrying 2 1 3   Worry too much - different things 1 1 3   Trouble relaxing 1 1 3   Restless 0 0 3  Easily annoyed or irritable 3 1 3   Afraid - awful might happen 0 0 3  Total GAD 7 Score 9 5 21       Past Medical History:  Diagnosis Date  . Anxiety   . Depression   . Migraines   . Tobacco use disorder     Past Surgical History:  Procedure Laterality Date  . ABDOMINAL HYSTERECTOMY    . LAPAROSCOPIC NEPHRECTOMY Left 08/2016   donor  . TUBAL LIGATION     Social History   Tobacco Use  . Smoking status: Current Every Day Smoker    Packs/day: 0.50    Years: 20.00    Pack years: 10.00    Types: Cigarettes  . Smokeless tobacco: Never Used  Substance Use Topics  . Alcohol use: Not Currently    Comment: 1-2 / year   family history is not on file.    ROS: negative except as noted in the HPI  Medications: Current Outpatient Medications  Medication Sig Dispense Refill  . ALPRAZolam (XANAX) 1 MG tablet Take 1 tablet (1 mg total) by mouth once as needed for up to 1 dose for anxiety (panic). 20 tablet 2  . escitalopram (LEXAPRO) 20 MG tablet Take 1 tablet (20 mg total) by mouth at bedtime. 90 tablet 0  .  traZODone (DESYREL) 150 MG tablet Take 1 tablet (150 mg total) by mouth at bedtime as needed for sleep. 90 tablet 0  . eletriptan (RELPAX) 40 MG tablet Take 1 tablet (40 mg total) by mouth as needed for migraine or headache. May repeat in 2 hours if headache persists or recurs. 10 tablet 0   No current facility-administered medications for this visit.    Allergies  Allergen Reactions  . Morphine And Related        Objective:  Temp 98.7 F (37.1 C) (Oral)   Wt 136 lb (61.7 kg)   BMI 21.30 kg/m  Gen:  alert, not ill-appearing, no distress, appropriate for age 27: head normocephalic without obvious abnormality, conjunctiva and cornea clear, trachea midline Pulm: Normal work of breathing, normal phonation Neuro: alert and oriented x 3 Psych: cooperative, depressed mood, affect mood-congruent, speech is articulate, normal rate and volume; thought processes clear and goal-directed, normal judgment, good insight, no SI    Assessment and Plan: 48 y.o. female with   .Melanie Hogan was seen today for medication management.  Diagnoses and all orders for this visit:  Moderate  episode of recurrent major depressive disorder (HCC) -     escitalopram (LEXAPRO) 20 MG tablet; Take 1 tablet (20 mg total) by mouth at bedtime.  GAD (generalized anxiety disorder) -     ALPRAZolam (XANAX) 1 MG tablet; Take 1 tablet (1 mg total) by mouth once as needed for up to 1 dose for anxiety (panic). -     traZODone (DESYREL) 150 MG tablet; Take 1 tablet (150 mg total) by mouth at bedtime as needed for sleep. -     escitalopram (LEXAPRO) 20 MG tablet; Take 1 tablet (20 mg total) by mouth at bedtime.  Migraine without aura and without status migrainosus, not intractable -     eletriptan (RELPAX) 40 MG tablet; Take 1 tablet (40 mg total) by mouth as needed for migraine or headache. May repeat in 2 hours if headache persists or recurs.     PHQ9=10, no acute safety issues GAD7=9 Scores are increased compared to 3 months ago. Patient feels this is situational related to COVID-19 pandemic Increasing Trazodone to 150 mg QHS Increasing Alprazolam to 1 mg once prn for panic Cont Lexapro 20 mg QHS Declined referral to [facility] for counseling F/u in 6 weeks MyChart activation code sent and she was encouraged to reach out before 6 weeks over MyChart with any questions/concerns   Follow Up Instructions:    I discussed the assessment and treatment plan with the patient. The patient was provided an opportunity to ask questions and all were answered. The patient agreed with the plan and demonstrated an understanding of the instructions.   The patient was advised to call back or seek an in-person evaluation if the symptoms worsen or if the condition fails to improve as anticipated.  I provided 15 minutes of non-face-to-face time during this encounter.   Trixie Dredge, [place]

## 2019-04-24 ENCOUNTER — Other Ambulatory Visit: Payer: Self-pay

## 2019-04-24 DIAGNOSIS — F331 Major depressive disorder, recurrent, moderate: Secondary | ICD-10-CM

## 2019-04-24 DIAGNOSIS — G43009 Migraine without aura, not intractable, without status migrainosus: Secondary | ICD-10-CM

## 2019-04-24 DIAGNOSIS — F411 Generalized anxiety disorder: Secondary | ICD-10-CM

## 2019-04-25 MED ORDER — TRAZODONE HCL 150 MG PO TABS: 150.0000 mg | ORAL_TABLET | Freq: Every evening | ORAL | 0 refills | Status: DC | PRN

## 2019-04-25 MED ORDER — ESCITALOPRAM OXALATE 20 MG PO TABS: 20.0000 mg | ORAL_TABLET | Freq: Every day | ORAL | 0 refills | Status: DC

## 2019-04-25 MED ORDER — ALPRAZOLAM 1 MG PO TABS: 1.0000 mg | ORAL_TABLET | Freq: Once | ORAL | 0 refills | Status: DC | PRN

## 2019-04-26 ENCOUNTER — Ambulatory Visit (INDEPENDENT_AMBULATORY_CARE_PROVIDER_SITE_OTHER): Payer: BC Managed Care – PPO | Admitting: Physician Assistant

## 2019-04-26 ENCOUNTER — Encounter: Payer: Self-pay | Admitting: Physician Assistant

## 2019-04-26 DIAGNOSIS — F331 Major depressive disorder, recurrent, moderate: Secondary | ICD-10-CM

## 2019-04-26 DIAGNOSIS — F411 Generalized anxiety disorder: Secondary | ICD-10-CM | POA: Diagnosis not present

## 2019-04-26 NOTE — Progress Notes (Signed)
Virtual Visit via Video Note  I connected with Melanie Hogan on 04/26/19 at 10:10 AM EDT by a video enabled telemedicine application and verified that I am speaking with the correct person using two identifiers.   I discussed the limitations of evaluation and management by telemedicine and the availability of in person appointments. The patient expressed understanding and agreed to proceed.  History of Present Illness: HPI:                                                                Melanie Hogan is a 48 y.o. 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 Reports she feels exhausted during the day Anxiety: "I think my anxiety and frustration is definitely situational." She is taking Alprazolam a couple days per week, but has had to double her dose to 2 mg Current medications: Lexapro 20 mg daily Trazodone 150 mg QHS  Alprazolam 1 mg daily prn anxiety  Depression screen [facility] 2/9 04/26/2019 01/02/2019 10/03/2018 09/05/2018  Decreased Interest 1 1 0 2  Down, Depressed, Hopeless 2 2 0 3  PHQ - 2 Score 3 3 0 5  Altered sleeping 3 3 2 3   Tired, decreased energy 2 1 1 3   Change in appetite 2 2 1 3   Feeling bad or failure about yourself  0 0 0 2  Trouble concentrating 1 1 0 2  Moving slowly or fidgety/restless 0 0 0 1  Suicidal thoughts 0 0 0 0  PHQ-9 Score 11 10 4 19     GAD 7 : Generalized Anxiety Score 04/26/2019 01/02/2019 10/03/2018 09/05/2018  Nervous, Anxious, on Edge 2 2 1 3   Control/stop worrying 1 2 1 3   Worry too much - different things 0 1 1 3   Trouble relaxing 0 1 1 3   Restless 1 0 0 3  Easily annoyed or irritable 3 3 1 3   Afraid - awful might happen 0 0 0 3  Total GAD 7 Score 7 9 5 21       Past Medical History:  Diagnosis Date  .  Anxiety   . Depression   . Migraines   . Tobacco use disorder    Past Surgical History:  Procedure Laterality Date  . ABDOMINAL HYSTERECTOMY    . LAPAROSCOPIC NEPHRECTOMY Left 08/2016   donor  . TUBAL LIGATION     Social History   Tobacco Use  . Smoking status: Current Every Day Smoker    Packs/day: 0.50    Years: 20.00    Pack years: 10.00    Types: Cigarettes  . Smokeless tobacco: Never Used  Substance Use Topics  . Alcohol use: Not Currently    Comment: 1-2 / year   family history is not on file.    ROS: negative except as noted in the HPI  Medications: Current Outpatient Medications  Medication Sig Dispense Refill  . ALPRAZolam (XANAX) 1 MG tablet Take 1 tablet (1 mg total) by mouth once as needed for up to 1 dose for anxiety (panic). 20 tablet 0  . eletriptan (RELPAX) 40 MG tablet  Take 1 tablet (40 mg total) by mouth as needed for migraine or headache. May repeat in 2 hours if headache persists or recurs. 10 tablet 0  . escitalopram (LEXAPRO) 20 MG tablet Take 1 tablet (20 mg total) by mouth at bedtime. 90 tablet 0  . traZODone (DESYREL) 150 MG tablet Take 1 tablet (150 mg total) by mouth at bedtime as needed for sleep. 90 tablet 0   No current facility-administered medications for this visit.    Allergies  Allergen Reactions  . Morphine And Related        Objective:  Temp 99.2 F (37.3 C) (Oral)   Neuro: alert and oriented x 3 Psych: cooperative, anxious mood, affect mood-congruent, speech is articulate, normal rate and volume; thought processes clear and goal-directed, normal judgment, good insight, no SI     No results found for this or any previous visit (from the past 72 hour(s)). No results found.    Assessment and Plan: 48 y.o. female with   .Diagnoses and all orders for this visit:  GAD (generalized anxiety disorder)  Moderate episode of recurrent major depressive disorder (HCC)   PHQ9=11, no acute safety issues GAD7=7 Stable   Situational stress/anxiety is contributory. Declines referral to counseling Continue current medications Follow-up in 3 months or sooner if symptoms worsen or fail to improve   Follow Up Instructions:    I discussed the assessment and treatment plan with the patient. The patient was provided an opportunity to ask questions and all were answered. The patient agreed with the plan and demonstrated an understanding of the instructions.   The patient was advised to call back or seek an in-person evaluation if the symptoms worsen or if the condition fails to improve as anticipated.  I provided 13 minutes of non-face-to-face time during this encounter.   Trixie Dredge, [place]

## 2019-07-24 ENCOUNTER — Ambulatory Visit (INDEPENDENT_AMBULATORY_CARE_PROVIDER_SITE_OTHER): Payer: BC Managed Care – PPO | Admitting: Osteopathic Medicine

## 2019-07-24 ENCOUNTER — Encounter: Payer: Self-pay | Admitting: Osteopathic Medicine

## 2019-07-24 VITALS — Temp 99.1°F | Wt 131.0 lb

## 2019-07-24 DIAGNOSIS — F331 Major depressive disorder, recurrent, moderate: Secondary | ICD-10-CM | POA: Diagnosis not present

## 2019-07-24 DIAGNOSIS — F411 Generalized anxiety disorder: Secondary | ICD-10-CM

## 2019-07-24 DIAGNOSIS — Z0000 Encounter for general adult medical examination without abnormal findings: Secondary | ICD-10-CM

## 2019-07-24 MED ORDER — ESCITALOPRAM OXALATE 20 MG PO TABS: 20.0000 mg | ORAL_TABLET | Freq: Every day | ORAL | 3 refills | Status: DC

## 2019-07-24 MED ORDER — TRAZODONE HCL 150 MG PO TABS: 150.0000 mg | ORAL_TABLET | Freq: Every evening | ORAL | 3 refills | Status: DC | PRN

## 2019-07-24 NOTE — Progress Notes (Signed)
Virtual Visit via Video (App used: Doximity) Note  I connected with      Melanie Hogan on 07/24/19 at 1:33 PM  by a telemedicine application and verified that I am speaking with the correct person using two identifiers.  Patient is at home I am in office   I discussed the limitations of evaluation and management by telemedicine and the availability of in person appointments. The patient expressed understanding and agreed to proceed.  History of Present Illness: Melanie Hogan is a 48 y.o. female who would like to discuss refill meds for mental health   Doing well on current mix of medications, would like to stay ont his! NO other questions or concerns today.   Depression screen [facility] 2/9 07/24/2019 04/26/2019 01/02/2019  Decreased Interest 0 1 1  Down, Depressed, Hopeless 0 2 2  PHQ - 2 Score 0 3 3  Altered sleeping 1 3 3   Tired, decreased energy 1 2 1   Change in appetite 0 2 2  Feeling bad or failure about yourself  0 0 0  Trouble concentrating 0 1 1  Moving slowly or fidgety/restless 0 0 0  Suicidal thoughts 0 0 0  PHQ-9 Score 2 11 10   Difficult doing work/chores Not difficult at all - -   GAD 7 : Generalized Anxiety Score 07/24/2019 04/26/2019 01/02/2019 10/03/2018  Nervous, Anxious, on Edge 1 2 2 1   Control/stop worrying 0 1 2 1   Worry too much - different things 0 0 1 1  Trouble relaxing 1 0 1 1  Restless 0 1 0 0  Easily annoyed or irritable 3 3 3 1   Afraid - awful might happen 0 0 0 0  Total GAD 7 Score 5 7 9 5   Anxiety Difficulty Not difficult at all - - -          Observations/Objective: Temp 99.1 F (37.3 C) (Oral)   Wt 131 lb (59.4 kg)   BMI 20.52 kg/m  BP Readings from Last 3 Encounters:  10/03/18 108/72  09/05/18 115/79  02/21/18 112/77   Exam: Normal Speech.  NAD  Lab and Radiology Results No results found for this or any previous visit (from the past 72 hour(s)). No results found.     Assessment and Plan: 48 y.o. female  with The primary encounter diagnosis was GAD (generalized anxiety disorder). Diagnoses of Moderate episode of recurrent major depressive disorder ([place]) and Annual physical exam were also pertinent to this visit.  Labs ordered for future visit. Annual physical / preventive care was NOT performed or billed today.    PDMP not reviewed this encounter. Orders Placed This Encounter  Procedures  . CBC  . COMPLETE METABOLIC PANEL WITH GFR  . LIPID SCREENING   Meds ordered this encounter  Medications  . escitalopram (LEXAPRO) 20 MG tablet    Sig: Take 1 tablet (20 mg total) by mouth at bedtime.    Dispense:  90 tablet    Refill:  3  . traZODone (DESYREL) 150 MG tablet    Sig: Take 1 tablet (150 mg total) by mouth at bedtime as needed for sleep.    Dispense:  90 tablet    Refill:  3     Follow Up Instructions: Return in about 6 months (around 01/22/2020) for ANNUAL (get labs prior to visit, orders are in).    I discussed the assessment and treatment plan with the patient. The patient was provided an opportunity to ask questions and all were  answered. The patient agreed with the plan and demonstrated an understanding of the instructions.   The patient was advised to call back or seek an in-person evaluation if any new concerns, if symptoms worsen or if the condition fails to improve as anticipated.  15 minutes of non-face-to-face time was provided during this encounter.      . . . . . . . . . . . . . Marland Kitchen                   Historical information moved to improve visibility of documentation.  Past Medical History:  Diagnosis Date  . Anxiety   . Depression   . Migraines   . Tobacco use disorder    Past Surgical History:  Procedure Laterality Date  . ABDOMINAL HYSTERECTOMY    . LAPAROSCOPIC NEPHRECTOMY Left 08/2016   donor  . TUBAL LIGATION     Social History   Tobacco Use  . Smoking status: Current Every Day Smoker    Packs/day: 0.50    Years:  20.00    Pack years: 10.00    Types: Cigarettes  . Smokeless tobacco: Never Used  Substance Use Topics  . Alcohol use: Not Currently    Comment: 1-2 / year   family history is not on file.  Medications: Current Outpatient Medications  Medication Sig Dispense Refill  . ALPRAZolam (XANAX) 1 MG tablet Take 1 tablet (1 mg total) by mouth once as needed for up to 1 dose for anxiety (panic). 20 tablet 0  . eletriptan (RELPAX) 40 MG tablet Take 1 tablet (40 mg total) by mouth as needed for migraine or headache. May repeat in 2 hours if headache persists or recurs. 10 tablet 0  . escitalopram (LEXAPRO) 20 MG tablet Take 1 tablet (20 mg total) by mouth at bedtime. 90 tablet 3  . traZODone (DESYREL) 150 MG tablet Take 1 tablet (150 mg total) by mouth at bedtime as needed for sleep. 90 tablet 3   No current facility-administered medications for this visit.   Allergies  Allergen Reactions  . Morphine And Related

## 2019-07-26 ENCOUNTER — Telehealth: Payer: Self-pay

## 2019-07-26 NOTE — Telephone Encounter (Signed)
-----   Message from Emeterio Reeve, DO sent at 07/24/2019  1:51 PM EST ----- Annual physical 01/2020 or 02/2020, lab orders are in for blood work ahead of visit

## 2019-07-29 NOTE — Telephone Encounter (Signed)
Appt scheduled. Thank you.

## 2019-12-11 ENCOUNTER — Other Ambulatory Visit: Payer: Self-pay

## 2019-12-11 ENCOUNTER — Ambulatory Visit (INDEPENDENT_AMBULATORY_CARE_PROVIDER_SITE_OTHER): Payer: Managed Care, Other (non HMO) | Admitting: Family Medicine

## 2019-12-11 ENCOUNTER — Encounter: Payer: Self-pay | Admitting: Family Medicine

## 2019-12-11 VITALS — Ht 66.93 in | Wt 131.7 lb

## 2019-12-11 DIAGNOSIS — R29898 Other symptoms and signs involving the musculoskeletal system: Secondary | ICD-10-CM | POA: Insufficient documentation

## 2019-12-11 DIAGNOSIS — Q600 Renal agenesis, unilateral: Secondary | ICD-10-CM | POA: Diagnosis not present

## 2019-12-11 DIAGNOSIS — R29818 Other symptoms and signs involving the nervous system: Secondary | ICD-10-CM | POA: Diagnosis not present

## 2019-12-11 DIAGNOSIS — IMO0002 Reserved for concepts with insufficient information to code with codable children: Secondary | ICD-10-CM

## 2019-12-11 DIAGNOSIS — R296 Repeated falls: Secondary | ICD-10-CM

## 2019-12-11 DIAGNOSIS — R32 Unspecified urinary incontinence: Secondary | ICD-10-CM

## 2019-12-11 NOTE — Progress Notes (Signed)
Melanie Hogan - 49 y.o. female MRN [id number]  Date of birth: [birth year]  Subjective Chief Complaint  Patient presents with  . Fall    HPI Melanie Hogan is a 49 y.o. female here today with complaint of recurrent falls.  She reports that over the past year she has had increasing frequency of falls.  This has worsened significantly over the past few months.  She reports that L leg "locks up" and she just falls over.  L leg does feel weak.  She denies weakness in upper extremity.  She denies significant back pain, numbness, or tingling into the legs.  She has developed urinary incontinence over the past few months as well.  Having to wear adult diapers for this.  She has not had fever, chills, headaches, or other focal neuro deficits.   Interestingly, she reports that her mother has almost identical symptoms and is being evaluated by her PCP today as well.   ROS:  A comprehensive ROS was completed and negative except as noted per HPI  Allergies  Allergen Reactions  . Morphine And Related     Past Medical History:  Diagnosis Date  . Anxiety   . Depression   . Migraines   . Tobacco use disorder     Past Surgical History:  Procedure Laterality Date  . ABDOMINAL HYSTERECTOMY    . LAPAROSCOPIC NEPHRECTOMY Left 08/2016   donor  . TUBAL LIGATION      Social History   Socioeconomic History  . Marital status: Single    Spouse name: Not on file  . Number of children: Not on file  . Years of education: Not on file  . Highest education level: Not on file  Occupational History  . Not on file  Tobacco Use  . Smoking status: Current Every Day Smoker    Packs/day: 0.50    Years: 20.00    Pack years: 10.00    Types: Cigarettes  . Smokeless tobacco: Never Used  Substance and Sexual Activity  . Alcohol use: Not Currently    Comment: 1-2 / year  . Drug use: Not Currently  . Sexual activity: Not Currently    Birth control/protection: Abstinence, Surgical  Other Topics  Concern  . Not on file  Social History Narrative  . Not on file   Social Determinants of Health   Financial Resource Strain:   . Difficulty of Paying Living Expenses:   Food Insecurity:   . Worried About Charity fundraiser in the Last Year:   . Arboriculturist in the Last Year:   Transportation Needs:   . Film/video editor (Medical):   Marland Kitchen Lack of Transportation (Non-Medical):   Physical Activity:   . Days of Exercise per Week:   . Minutes of Exercise per Session:   Stress:   . Feeling of Stress :   Social Connections:   . Frequency of Communication with Friends and Family:   . Frequency of Social Gatherings with Friends and Family:   . Attends Religious Services:   . Active Member of Clubs or Organizations:   . Attends Archivist Meetings:   Marland Kitchen Marital Status:     No family history on file.  Health Maintenance  Topic Date Due  . HIV Screening  Never done  . COVID-19 Vaccine (1) Never done  . TETANUS/TDAP  Never done  . PAP SMEAR-Modifier  Never done  . INFLUENZA VACCINE  03/08/2020     ----------------------------------------------------------------------------------------------------------------------------------------------------------------------------------------------------------------- Physical Exam  Ht 5' 6.93" (1.7 m)   Wt 131 lb 11.2 oz (59.7 kg)   SpO2 96%   BMI 20.67 kg/m   Physical Exam Constitutional:      Appearance: Normal appearance.  HENT:     Head: Normocephalic and atraumatic.  Eyes:     General: No scleral icterus. Cardiovascular:     Rate and Rhythm: Normal rate and regular rhythm.  Pulmonary:     Effort: Pulmonary effort is normal.     Breath sounds: Normal breath sounds.  Musculoskeletal:     Cervical back: Normal range of motion and neck supple.  Neurological:     Mental Status: She is alert.     Cranial Nerves: No cranial nerve deficit.     Coordination: Coordination normal.     Comments: Strength: RUE 5/5 LUE  5/5 LLE 4/5 RLE 5/5  Bilateral hyperreflexia of lower extremities.  Sensation normal in all extremities.   Gait is slow, slightly widened.  Has to hold on to wall to steady herself.   Psychiatric:        Mood and Affect: Mood normal.        Behavior: Behavior normal.     ------------------------------------------------------------------------------------------------------------------------------------------------------------------------------------------------------------------- Assessment and Plan  Left leg weakness DDx includes spinal stenosis, tumor/mass lesion to spinal cord or brain, infectious etiology or vitamin deficiency.  Labs ordered today including cmp, cbc, rpr, tsh, and b12.  MRI of brain and lumbar spine ordered.   Discussed referral to neuro if labs and MRI do not provide cause for her symptoms.    No orders of the defined types were placed in this encounter.   No follow-ups on file.   35 minutes spent including pre visit preparation, review of prior notes and labs, encounter with patient and same day documentation.  This visit occurred during the SARS-CoV-2 public health emergency.  Safety protocols were in place, including screening questions prior to the visit, additional usage of staff PPE, and extensive cleaning of exam room while observing appropriate contact time as indicated for disinfecting solutions.

## 2019-12-11 NOTE — Patient Instructions (Signed)
Nice to meet you today! Please have labs completed today  You'll receive a call to have MRI scheduled.

## 2019-12-11 NOTE — Assessment & Plan Note (Addendum)
DDx includes spinal stenosis, tumor/mass lesion to spinal cord or brain, infectious etiology or vitamin deficiency It is interesting that her mother has similar symptoms suggesting genetic or environmental component. .  Labs ordered today including cmp, cbc, rpr, tsh, and b12.  MRI of brain and lumbar spine ordered.   Discussed referral to neuro if labs and MRI do not provide cause for her symptoms.

## 2019-12-12 LAB — COMPLETE METABOLIC PANEL WITH GFR
AG Ratio: 2.1 (calc) (ref 1.0–2.5)
ALT: 19 U/L (ref 6–29)
AST: 19 U/L (ref 10–35)
Albumin: 4.5 g/dL (ref 3.6–5.1)
Alkaline phosphatase (APISO): 79 U/L (ref 31–125)
BUN/Creatinine Ratio: 13 (calc) (ref 6–22)
BUN: 16 mg/dL (ref 7–25)
CO2: 30 mmol/L (ref 20–32)
Calcium: 10 mg/dL (ref 8.6–10.2)
Chloride: 106 mmol/L (ref 98–110)
Creat: 1.19 mg/dL — ABNORMAL HIGH (ref 0.50–1.10)
GFR, Est African American: 63 mL/min/{1.73_m2} (ref >=60)
GFR, Est Non African American: 54 mL/min/{1.73_m2} — ABNORMAL LOW (ref >=60)
Globulin: 2.1 g/dL (calc) (ref 1.9–3.7)
Glucose, Bld: 109 mg/dL — ABNORMAL HIGH (ref 65–99)
Potassium: 4.7 mmol/L (ref 3.5–5.3)
Sodium: 142 mmol/L (ref 135–146)
Total Bilirubin: 0.3 mg/dL (ref 0.2–1.2)
Total Protein: 6.6 g/dL (ref 6.1–8.1)

## 2019-12-12 LAB — RPR: RPR Ser Ql: NONREACTIVE

## 2019-12-12 LAB — TSH: TSH: 1.04 mIU/L

## 2019-12-12 LAB — CBC
HCT: 39.7 % (ref 35.0–45.0)
Hemoglobin: 13.2 g/dL (ref 11.7–15.5)
MCH: 31.3 pg (ref 27.0–33.0)
MCHC: 33.2 g/dL (ref 32.0–36.0)
MCV: 94.1 fL (ref 80.0–100.0)
MPV: 11.2 fL (ref 7.5–12.5)
Platelets: 261 10*3/uL (ref 140–400)
RBC: 4.22 10*6/uL (ref 3.80–5.10)
RDW: 12.4 % (ref 11.0–15.0)
WBC: 9.6 10*3/uL (ref 3.8–10.8)

## 2019-12-12 LAB — VITAMIN B12: Vitamin B-12: 563 pg/mL (ref 200–1100)

## 2019-12-21 ENCOUNTER — Ambulatory Visit (INDEPENDENT_AMBULATORY_CARE_PROVIDER_SITE_OTHER): Payer: Managed Care, Other (non HMO)

## 2019-12-21 ENCOUNTER — Other Ambulatory Visit: Payer: Self-pay

## 2019-12-21 DIAGNOSIS — R29818 Other symptoms and signs involving the nervous system: Secondary | ICD-10-CM

## 2019-12-21 DIAGNOSIS — R296 Repeated falls: Secondary | ICD-10-CM | POA: Diagnosis not present

## 2019-12-21 DIAGNOSIS — R29898 Other symptoms and signs involving the musculoskeletal system: Secondary | ICD-10-CM

## 2019-12-21 DIAGNOSIS — R32 Unspecified urinary incontinence: Secondary | ICD-10-CM

## 2019-12-27 ENCOUNTER — Other Ambulatory Visit: Payer: Self-pay | Admitting: Family Medicine

## 2020-01-22 ENCOUNTER — Other Ambulatory Visit: Payer: Self-pay

## 2020-01-22 ENCOUNTER — Encounter: Payer: Self-pay | Admitting: Osteopathic Medicine

## 2020-01-22 ENCOUNTER — Ambulatory Visit (INDEPENDENT_AMBULATORY_CARE_PROVIDER_SITE_OTHER): Payer: Managed Care, Other (non HMO) | Admitting: Osteopathic Medicine

## 2020-01-22 VITALS — BP 131/89 | HR 71 | Temp 98.0°F | Ht 66.0 in | Wt 134.0 lb

## 2020-01-22 DIAGNOSIS — Z0000 Encounter for general adult medical examination without abnormal findings: Secondary | ICD-10-CM

## 2020-01-22 DIAGNOSIS — R29898 Other symptoms and signs involving the musculoskeletal system: Secondary | ICD-10-CM

## 2020-01-22 DIAGNOSIS — R296 Repeated falls: Secondary | ICD-10-CM

## 2020-01-22 NOTE — Patient Instructions (Addendum)
General Preventive Care  Most recent routine screening labs: ordered today.   Blood pressure goal 130/80 or less.   Tobacco: don't! Please let me know if you need help quitting!  Alcohol: responsible moderation is ok for most adults - if you have concerns about your alcohol intake, please talk to me!   Exercise: as tolerated to reduce risk of cardiovascular disease and diabetes. Strength training will also prevent osteoporosis.   Mental health: if need for mental health care (medicines, counseling, other), or concerns about moods, please let me know!   Sexual / Reproductive health: if need for STD testing, or if concerns with libido/pain problems, please let me know!  Advanced Directive: Living Will and/or Healthcare Power of Attorney recommended for all adults, regardless of age or health.  Vaccines  Flu vaccine: for almost everyone, every fall.   Shingles vaccine: after age 31.   Pneumonia vaccines: after age 47  Tetanus booster: every 10 years   COVID vaccine: STRONGLY RECOMMENDED  Cancer screenings   Colon cancer screening: for everyone age 40-75. Colonoscopy available for all, many people also qualify for the Cologuard stool test   Breast cancer screening: mammogram at age 75 every other year at least, and annually after age 74.   Cervical cancer screening: Can usually stop w/ hysterectomy.   Lung cancer screening: not needed for light smokers   Infection screenings  . HIV: recommended screening at least once age 44-65 . Gonorrhea/Chlamydia: screening as needed . Hepatitis C: recommended once for everyone age 46-75 . TB: certain at-risk populations Other . Bone Density Test: recommended for women at age 28

## 2020-01-22 NOTE — Progress Notes (Signed)
Melanie Hogan is a 49 y.o. female who presents to  [place] [facility]  today, 01/22/20, seeking care for the following: . Annual physical . Persistent L leg weakness, pt reports never heard back re: neurology referral, see MRI result notes      ASSESSMENT & PLAN with other pertinent history/findings:  The primary encounter diagnosis was Annual physical exam. Diagnoses of Left leg weakness and Recurrent falls were also pertinent to this visit.   Patient Instructions  General Preventive Care  Most recent routine screening labs: ordered today.   Blood pressure goal 130/80 or less.   Tobacco: don't! Please let me know if you need help quitting!  Alcohol: responsible moderation is ok for most adults - if you have concerns about your alcohol intake, please talk to me!   Exercise: as tolerated to reduce risk of cardiovascular disease and diabetes. Strength training will also prevent osteoporosis.   Mental health: if need for mental health care (medicines, counseling, other), or concerns about moods, please let me know!   Sexual / Reproductive health: if need for STD testing, or if concerns with libido/pain problems, please let me know!  Advanced Directive: Living Will and/or Healthcare Power of Attorney recommended for all adults, regardless of age or health.  Vaccines  Flu vaccine: for almost everyone, every fall.   Shingles vaccine: after age 34.   Pneumonia vaccines: after age 42  Tetanus booster: every 10 years   COVID vaccine: STRONGLY RECOMMENDED  Cancer screenings   Colon cancer screening: for everyone age 84-75. Colonoscopy available for all, many people also qualify for the Cologuard stool test   Breast cancer screening: mammogram at age 88 every other year at least, and annually after age 69.   Cervical cancer screening: Can usually stop w/ hysterectomy.   Lung cancer screening: not needed for light smokers    Infection screenings  . HIV: recommended screening at least once age 69-65 . Gonorrhea/Chlamydia: screening as needed . Hepatitis C: recommended once for everyone age 37-75 . TB: certain at-risk populations Other . Bone Density Test: recommended for women at age 51    Orders Placed This Encounter  Procedures  . CBC  . COMPLETE METABOLIC PANEL WITH GFR  . Lipid panel  . Ambulatory referral to Neurology    No orders of the defined types were placed in this encounter.   Constitutional:  . VSS, see nurse notes . General Appearance: alert, well-developed, well-nourished, NAD Eyes: Marland Kitchen Normal lids and conjunctive, non-icteric sclera Neck: . No masses, trachea midline . No thyroid enlargement/tenderness/mass appreciated Respiratory: . Normal respiratory effort . No dullness/hyper-resonance to percussion . Breath sounds normal, no wheeze/rhonchi/rales Cardiovascular: . S1/S2 normal, no murmur/rub/gallop auscultated . No lower extremity edema Gastrointestinal: . Nontender, no masses . No hepatomegaly, no splenomegaly . No hernia appreciated Musculoskeletal:  . Gait antalgic favoring L leg . Strength 5/5 BUE, 4/5 LLE, 5/5 RLE Neurological: . No cranial nerve deficit on limited exam . Motor and sensation intact and symmetric Psychiatric: . Normal judgment/insight . Normal mood and affect    Follow-up instructions: Return in about 1 year (around 01/21/2021) for [place] (call week prior to visit for lab orders).                                         BP 131/89 (BP Location: Left Arm, Patient Position:  Sitting)   Pulse 71   Temp 98 F (36.7 C)   Ht 5\' 6"  (1.676 m)   Wt 134 lb (60.8 kg)   SpO2 98%   BMI 21.63 kg/m   Current Meds  Medication Sig  . ALPRAZolam (XANAX) 1 MG tablet Take 1 tablet (1 mg total) by mouth once as needed for up to 1 dose for anxiety (panic).  Marland Kitchen eletriptan (RELPAX) 40 MG tablet Take 1 tablet (40 mg  total) by mouth as needed for migraine or headache. May repeat in 2 hours if headache persists or recurs.  Marland Kitchen escitalopram (LEXAPRO) 20 MG tablet Take 1 tablet (20 mg total) by mouth at bedtime.  . traZODone (DESYREL) 150 MG tablet Take 1 tablet (150 mg total) by mouth at bedtime as needed for sleep.    No results found for this or any previous visit (from the past 72 hour(s)).  No results found.  Depression screen [facility] 2/9 01/22/2020 07/24/2019 04/26/2019  Decreased Interest 1 0 1  Down, Depressed, Hopeless 1 0 2  PHQ - 2 Score 2 0 3  Altered sleeping 2 1 3   Tired, decreased energy 3 1 2   Change in appetite 3 0 2  Feeling bad or failure about yourself  1 0 0  Trouble concentrating 1 0 1  Moving slowly or fidgety/restless 0 0 0  Suicidal thoughts 0 0 0  PHQ-9 Score 12 2 11   Difficult doing work/chores Very difficult Not difficult at all -    GAD 7 : Generalized Anxiety Score 01/22/2020 07/24/2019 04/26/2019 01/02/2019  Nervous, Anxious, on Edge 3 1 2 2   Control/stop worrying 2 0 1 2  Worry too much - different things 2 0 0 1  Trouble relaxing 2 1 0 1  Restless 2 0 1 0  Easily annoyed or irritable 2 3 3 3   Afraid - awful might happen 1 0 0 0  Total GAD 7 Score 14 5 7 9   Anxiety Difficulty Somewhat difficult Not difficult at all - -      All questions at time of visit were answered - patient instructed to contact office with any additional concerns or updates.  ER/RTC precautions were reviewed with the patient.  Please note: voice recognition software was used to produce this document, and typos may escape review. Please contact Dr. Sheppard Coil for any needed clarifications.

## 2020-01-31 ENCOUNTER — Encounter: Payer: Self-pay | Admitting: Osteopathic Medicine

## 2020-02-14 DIAGNOSIS — C4359 Malignant melanoma of other part of trunk: Secondary | ICD-10-CM | POA: Insufficient documentation

## 2020-03-23 ENCOUNTER — Ambulatory Visit: Payer: Self-pay | Admitting: Neurology

## 2020-04-02 ENCOUNTER — Emergency Department (INDEPENDENT_AMBULATORY_CARE_PROVIDER_SITE_OTHER)
Admission: EM | Admit: 2020-04-02 | Discharge: 2020-04-02 | Disposition: A | Discharge status: Home / Self Care | Payer: Managed Care, Other (non HMO) | Source: Home / Self Care

## 2020-04-02 ENCOUNTER — Emergency Department (INDEPENDENT_AMBULATORY_CARE_PROVIDER_SITE_OTHER): Payer: Managed Care, Other (non HMO)

## 2020-04-02 ENCOUNTER — Encounter: Payer: Self-pay | Admitting: Emergency Medicine

## 2020-04-02 ENCOUNTER — Other Ambulatory Visit: Payer: Self-pay

## 2020-04-02 DIAGNOSIS — Y92009 Unspecified place in unspecified non-institutional (private) residence as the place of occurrence of the external cause: Secondary | ICD-10-CM

## 2020-04-02 DIAGNOSIS — W1839XA Other fall on same level, initial encounter: Secondary | ICD-10-CM | POA: Diagnosis not present

## 2020-04-02 DIAGNOSIS — M25512 Pain in left shoulder: Secondary | ICD-10-CM | POA: Diagnosis not present

## 2020-04-02 DIAGNOSIS — S42202A Unspecified fracture of upper end of left humerus, initial encounter for closed fracture: Secondary | ICD-10-CM

## 2020-04-02 DIAGNOSIS — Z9181 History of falling: Secondary | ICD-10-CM

## 2020-04-02 DIAGNOSIS — W19XXXA Unspecified fall, initial encounter: Secondary | ICD-10-CM

## 2020-04-02 DIAGNOSIS — R509 Fever, unspecified: Secondary | ICD-10-CM

## 2020-04-02 HISTORY — DX: History of falling: Z91.81

## 2020-04-02 NOTE — Discharge Instructions (Addendum)
  Call your orthopedist today or tomorrow to schedule follow up appointment next week for further evaluation of shoulder injury.  Additional imaging may be indicated.   Due to new fever and recent sinus surgery, it is recommended you call your surgeon today and let them know about fever. They may change antibiotics, recommend sooner follow up in office and/or a Covid-19 test. Please go to the hospital if symptoms worsening.

## 2020-04-02 NOTE — ED Triage Notes (Signed)
History of falls x 1+ years - unknown etiology Sees a neurologist in September Pt fell at 10 am yesterday and landed on the floor (carpet) on to her left shoulder- pain & limited ROM Pt in w/c in triage due to mobility issues Pt had chills & night sweats last night  Temp in triage is 100.5 Pt refused tylenol due to H/A history  NO COVID vaccine Pt refused a COVID test - last test was 1 week ago prior to surgery for deviated septum this past Monday

## 2020-04-02 NOTE — ED Provider Notes (Signed)
[name]    CSN: [id number] Arrival date & time: 04/02/20  1227      History   Chief Complaint Chief Complaint  Patient presents with  . Shoulder Injury    left    HPI Melanie Hogan is a 49 y.o. female.   HPI  Melanie Hogan is a 49 y.o. female presenting to UC with c/o Left shoulder pain that started yesterday at 10AM after falling at home while trying to stretch her calves.  Hx of falls for over 1 year. She has been followed by orthopedist and is seeing a neurologist in September.  Denies HA or dizziness. Denies chest pain or palpitations.  Pain is shoulder is aching and sore, worse with movement.  She took vicodin at 11AM this morning with mild relief.  Pt found to have a temp of 100.5*F in triage. She had nasal septum surgery on Monday, 03/30/20. She is on keflex. She developed chills last night. She has not called her surgeon.  Pt feels well besides some chills and now the shoulder injury pain.  Past Medical History:  Diagnosis Date  . Anxiety   . At high risk for falls   . Depression   . Migraines   . Tobacco use disorder     Patient Active Problem List   Diagnosis Date Noted  . Malignant melanoma of torso excluding breast ([place]) 02/14/2020  . Left leg weakness 12/11/2019  . Benign cyst of right breast 10/12/2018  . GAD (generalized anxiety disorder) 09/05/2018  . Moderate episode of recurrent major depressive disorder ([place]) 09/05/2018  . Tobacco use disorder 09/05/2018  . Migraine without aura and without status migrainosus, not intractable 09/05/2018  . Renal insufficiency 09/05/2018  . S/p nephrectomy 09/28/2016  . Donor of kidney for transplant 08/29/2016    Past Surgical History:  Procedure Laterality Date  . ABDOMINAL HYSTERECTOMY    . LAPAROSCOPIC NEPHRECTOMY Left 08/2016   donor  . TUBAL LIGATION      OB History    Gravida  1   Para  0   Term      Preterm      AB  1   Living        SAB  1   TAB      Ectopic       Multiple      Live Births               Home Medications    Prior to Admission medications   Medication Sig Start Date End Date Taking? Authorizing Provider  cephALEXin (KEFLEX) 250 MG capsule Take 750 mg by mouth 2 (two) times daily. 03/24/20  Yes [provider]  escitalopram (LEXAPRO) 20 MG tablet Take 1 tablet (20 mg total) by mouth at bedtime. 07/24/19  Yes Emeterio Reeve, DO  HYDROcodone-acetaminophen (NORCO/VICODIN) 5-325 MG tablet TAKE 1 TABLET BY MOUTH EVERY 6 HOURS AS NEEDED FOR UP TO 7 DAYS. 02/28/20  Yes [provider]  methocarbamol (ROBAXIN) 500 MG tablet Take by mouth. 03/19/20  Yes [provider]  traZODone (DESYREL) 150 MG tablet Take 1 tablet (150 mg total) by mouth at bedtime as needed for sleep. 07/24/19  Yes Emeterio Reeve, DO  ALPRAZolam Duanne Moron) 1 MG tablet Take 1 tablet (1 mg total) by mouth once as needed for up to 1 dose for anxiety (panic). 04/25/19   Trixie Dredge, PA-C  eletriptan (RELPAX) 40 MG tablet Take 1 tablet (40 mg total) by mouth  as needed for migraine or headache. May repeat in 2 hours if headache persists or recurs. 01/02/19   Trixie Dredge, PA-C    Family History Family History  Problem Relation Age of Onset  . Healthy Mother   . Cancer Father     Social History Social History   Tobacco Use  . Smoking status: Current Every Day Smoker    Packs/day: 0.50    Years: 20.00    Pack years: 10.00    Types: Cigarettes  . Smokeless tobacco: Never Used  Vaping Use  . Vaping Use: Never used  Substance Use Topics  . Alcohol use: Not Currently    Comment: 1-2 / year  . Drug use: Not Currently     Allergies   Morphine and related   Review of Systems Review of Systems  Musculoskeletal: Positive for arthralgias. Negative for neck pain.  Skin: Negative for color change and wound.     Physical Exam Triage Vital Signs ED Triage Vitals  Enc Vitals Group     BP 04/02/20  1256 112/74     Pulse Rate 04/02/20 1248 82     Resp 04/02/20 1248 17     Temp 04/02/20 1256 (!) 100.5 F (38.1 C)     Temp Source 04/02/20 1248 Oral     SpO2 04/02/20 1248 98 %     Weight --      Height --      Head Circumference --      Peak Flow --      Pain Score 04/02/20 1256 3     Pain Loc --      Pain Edu? --      Excl. in [place]? --    No data found.  Updated Vital Signs BP 112/74   Pulse 82   Temp (!) 100.5 F (38.1 C)   Resp 17   SpO2 98%   Visual Acuity Right Eye Distance:   Left Eye Distance:   Bilateral Distance:    Right Eye Near:   Left Eye Near:    Bilateral Near:     Physical Exam Vitals and nursing note reviewed.  Constitutional:      Appearance: Normal appearance. She is well-developed.  HENT:     Head: Normocephalic and atraumatic.  Cardiovascular:     Rate and Rhythm: Normal rate and regular rhythm.     Pulses:          Radial pulses are 2+ on the left side.  Pulmonary:     Effort: Pulmonary effort is normal.  Musculoskeletal:     Cervical back: Normal range of motion.     Comments: Left shoulder: no obvious deformity. tenderness to anterior aspect and over proximal deltoid. Significantly limited abduction due to pain. Left elbow: non-tender, full ROM. 5/5 grip strength  Skin:    General: Skin is warm and dry.     Capillary Refill: Capillary refill takes less than 2 seconds.  Neurological:     Mental Status: She is alert and oriented to person, place, and time.     Sensory: No sensory deficit.  Psychiatric:        Behavior: Behavior normal.      UC Treatments / Results  Labs (all labs ordered are listed, but only abnormal results are displayed) Labs Reviewed - No data to display  EKG   Radiology DG Shoulder Left  Result Date: 04/02/2020 CLINICAL DATA:  Left shoulder pain after fall EXAM: LEFT SHOULDER - 2+ VIEW  COMPARISON:  None. FINDINGS: Subtle cortical lucency at the greater tuberosity raises the suspicion for a  nondisplaced fracture. Glenohumeral joint intact without dislocation. AC joint intact and unremarkable. There is a subtle ovoid lucency along the medial aspect of the proximal left humeral metaphysis measuring approximately 1.1 cm without endosteal scalloping or cortical breakthrough. Soft tissues within normal limits. IMPRESSION: 1. Subtle cortical lucency at the greater tuberosity raises the suspicion for a nondisplaced fracture. Consider CT for further evaluation. 2. Nonspecific 1.1 cm ovoid lucency along the medial aspect of the proximal left humeral metaphysis. This finding could also be further evaluated on the previously recommended CT. Electronically Signed   By: Davina Poke D.O.   On: 04/02/2020 13:47    Procedures Procedures (including critical care time)  Medications Ordered in UC Medications - No data to display  Initial Impression / Assessment and Plan / UC Course  I have reviewed the triage vital signs and the nursing notes.  Pertinent labs & imaging results that were available during my care of the patient were reviewed by me and considered in my medical decision making (see chart for details).     Pt refused Covid-19 testing for fever   Discussed imaging with pt Placed in cuff and collar sling F/u with orthopedist, pt is seen by [facility] to call surgeon about current fever 4 days post-op despite being on keflex and taking hydrocodone-acetaminophen 2 hours PTA. AVS given   Final Clinical Impressions(s) / UC Diagnoses   Final diagnoses:  Closed traumatic nondisplaced fracture of proximal end of left humerus, initial encounter  Fall as cause of accidental injury in home as place of occurrence, initial encounter  Acute pain of left shoulder  Fever in adult     Discharge Instructions      Call your orthopedist today or tomorrow to schedule follow up appointment next week for further evaluation of shoulder injury.  Additional imaging may be indicated.     Due to new fever and recent sinus surgery, it is recommended you call your surgeon today and let them know about fever. They may change antibiotics, recommend sooner follow up in office and/or a Covid-19 test. Please go to the hospital if symptoms worsening.     ED Prescriptions    None     I have reviewed the PDMP during this encounter.   Noe Gens, PA-C 04/02/20 1440

## 2020-04-14 ENCOUNTER — Ambulatory Visit: Payer: Self-pay | Admitting: Neurology

## 2020-04-20 ENCOUNTER — Telehealth (INDEPENDENT_AMBULATORY_CARE_PROVIDER_SITE_OTHER): Payer: Managed Care, Other (non HMO) | Admitting: Nurse Practitioner

## 2020-04-20 ENCOUNTER — Encounter: Payer: Self-pay | Admitting: Nurse Practitioner

## 2020-04-20 DIAGNOSIS — F411 Generalized anxiety disorder: Secondary | ICD-10-CM | POA: Diagnosis not present

## 2020-04-20 MED ORDER — ALPRAZOLAM 1 MG PO TABS: ORAL_TABLET | ORAL | 0 refills | Status: DC

## 2020-04-20 NOTE — Progress Notes (Signed)
Virtual Video Visit via MyChart Note  I connected with  Melanie Hogan on 04/20/20 at  9:30 AM EDT by the video enabled telemedicine application for , MyChart, and verified that I am speaking with the correct person using two identifiers.   I introduced myself as a Designer, jewellery with the practice. We discussed the limitations of evaluation and management by telemedicine and the availability of in person appointments. The patient expressed understanding and agreed to proceed.  The patient is: at home I am: in the office  Subjective:    CC:  Chief Complaint  Patient presents with  . Anxiety    HPI: Melanie Hogan is a 49 y.o. y/o female presenting via [place] today for increased anxiety. She has been experiencing severe anxiety symptoms due to health concerns and these are significantly exacerbated when she has to leave the house. She has a recent new diagnosis of cancer and has been dealing with neurological issues for the past several months.   She reports has been experiencing severe tremors and she feels incredibly overwhelmed. She is unable to drive and she walks with a walker at this time. She has had a few falls recently and broke her arm due to the falls. She has gone to see the neurologist over the falls and tremors. She reports the neurologist recommended that she gets back on medication for situational anxiety. She has taken alprazolam in the past with success and wishes to try this again today. She is currently taking Lexapro 20mg  daily.   She has a nerve conduction study today and EMG with neurology to try to find the origin of her neurological issues.   GAD 7 : Generalized Anxiety Score 04/20/2020 01/22/2020 07/24/2019 04/26/2019  Nervous, Anxious, on Edge 3 3 1 2   Control/stop worrying 1 2 0 1  Worry too much - different things 2 2 0 0  Trouble relaxing 2 2 1  0  Restless 0 2 0 1  Easily annoyed or irritable 1 2 3 3   Afraid - awful might happen 3 1 0 0  Total  GAD 7 Score 12 14 5 7   Anxiety Difficulty Extremely difficult Somewhat difficult Not difficult at all -    PHQ9 SCORE ONLY 04/20/2020 01/22/2020 07/24/2019  PHQ-9 Total Score 13 12 2      Past medical history, Surgical history, Family history not pertinant except as noted below, Social history, Allergies, and medications have been entered into the medical record, reviewed, and corrections made.   Review of Systems:  See HPI for pertinent positive and negatives  Objective:    General: Speaking clearly in complete sentences without any shortness of breath.   Alert and oriented x3.   Normal judgment.  No apparent acute distress.   Impression and Recommendations:   1. GAD (generalized anxiety disorder) Exacerbation of GAD due to situational anxiety over her health and health concerns. She is on a daily medication for anxiety, however, this is not effective enough for her panic symptoms.  We did discuss the risk of increased falls with benzodiazepine use and the importance of using this with extreme moderation to prevent sedation, dizziness, or increased risk of falls and injury- she expresses understanding.  Will refill alprazolam prescription today with instructions to start with 1/2 dose and repeat, if necessary with no more than 2 doses in a 24 hour period. PDMP checked today.   - ALPRAZolam (XANAX) 1 MG tablet; Take 1/2 tab (0.5 mg) by mouth- may take the other 1/2  tab (total of 1 mg) if symptoms are not improved in 15 minutes for severe anxiety. Do not take more than 2 tabs per day.  Dispense: 30 tablet; Refill: 0  Follow-up if symptoms worsen or fail to improve.    I discussed the assessment and treatment plan with the patient. The patient was provided an opportunity to ask questions and all were answered. The patient agreed with the plan and demonstrated an understanding of the instructions.   The patient was advised to call back or seek an in-person evaluation if the symptoms  worsen or if the condition fails to improve as anticipated.  I provided 20 minutes of non-face-to-face interaction with this [facility] visit including intake, same-day documentation, and chart review.   Orma Render, NP

## 2020-05-05 ENCOUNTER — Other Ambulatory Visit: Payer: Self-pay | Admitting: Orthopedic Surgery

## 2020-05-05 DIAGNOSIS — M25512 Pain in left shoulder: Secondary | ICD-10-CM

## 2020-05-11 ENCOUNTER — Other Ambulatory Visit: Payer: Self-pay

## 2020-05-11 ENCOUNTER — Ambulatory Visit
Admission: RE | Admit: 2020-05-11 | Discharge: 2020-05-11 | Disposition: A | Discharge status: Home / Self Care | Payer: Managed Care, Other (non HMO) | Source: Ambulatory Visit | Attending: Orthopedic Surgery | Admitting: Orthopedic Surgery

## 2020-05-11 DIAGNOSIS — M25512 Pain in left shoulder: Secondary | ICD-10-CM

## 2020-06-24 DIAGNOSIS — G971 Other reaction to spinal and lumbar puncture: Secondary | ICD-10-CM | POA: Insufficient documentation

## 2020-07-13 ENCOUNTER — Other Ambulatory Visit: Payer: Self-pay

## 2020-07-13 ENCOUNTER — Other Ambulatory Visit: Payer: Self-pay | Admitting: Osteopathic Medicine

## 2020-07-13 DIAGNOSIS — F331 Major depressive disorder, recurrent, moderate: Secondary | ICD-10-CM

## 2020-07-13 DIAGNOSIS — F411 Generalized anxiety disorder: Secondary | ICD-10-CM

## 2020-07-13 MED ORDER — ALPRAZOLAM 1 MG PO TABS: ORAL_TABLET | ORAL | 0 refills | Status: DC

## 2020-10-21 ENCOUNTER — Other Ambulatory Visit: Payer: Self-pay

## 2020-10-21 DIAGNOSIS — F331 Major depressive disorder, recurrent, moderate: Secondary | ICD-10-CM

## 2020-10-21 DIAGNOSIS — F411 Generalized anxiety disorder: Secondary | ICD-10-CM

## 2020-10-21 MED ORDER — ESCITALOPRAM OXALATE 20 MG PO TABS: 20.0000 mg | ORAL_TABLET | Freq: Every day | ORAL | 1 refills | Status: DC

## 2020-10-30 ENCOUNTER — Other Ambulatory Visit: Payer: Self-pay

## 2020-10-30 DIAGNOSIS — F411 Generalized anxiety disorder: Secondary | ICD-10-CM

## 2020-10-30 MED ORDER — TRAZODONE HCL 150 MG PO TABS: 150.0000 mg | ORAL_TABLET | Freq: Every evening | ORAL | 0 refills | Status: DC | PRN

## 2020-11-24 DIAGNOSIS — M4854XA Collapsed vertebra, not elsewhere classified, thoracic region, initial encounter for fracture: Secondary | ICD-10-CM | POA: Insufficient documentation

## 2021-01-02 ENCOUNTER — Other Ambulatory Visit: Payer: Self-pay | Admitting: Osteopathic Medicine

## 2021-01-02 DIAGNOSIS — F411 Generalized anxiety disorder: Secondary | ICD-10-CM

## 2021-01-02 DIAGNOSIS — F331 Major depressive disorder, recurrent, moderate: Secondary | ICD-10-CM

## 2021-01-07 ENCOUNTER — Other Ambulatory Visit: Payer: Self-pay | Admitting: Osteopathic Medicine

## 2021-01-07 DIAGNOSIS — F411 Generalized anxiety disorder: Secondary | ICD-10-CM

## 2021-01-28 ENCOUNTER — Other Ambulatory Visit: Payer: Self-pay | Admitting: Osteopathic Medicine

## 2021-01-28 DIAGNOSIS — F411 Generalized anxiety disorder: Secondary | ICD-10-CM

## 2021-03-31 ENCOUNTER — Telehealth (INDEPENDENT_AMBULATORY_CARE_PROVIDER_SITE_OTHER): Payer: Managed Care, Other (non HMO) | Admitting: Osteopathic Medicine

## 2021-03-31 ENCOUNTER — Encounter: Payer: Self-pay | Admitting: Osteopathic Medicine

## 2021-03-31 VITALS — BP 110/70 | Temp 99.1°F | Wt 135.0 lb

## 2021-03-31 DIAGNOSIS — F411 Generalized anxiety disorder: Secondary | ICD-10-CM

## 2021-03-31 DIAGNOSIS — G1221 Amyotrophic lateral sclerosis: Secondary | ICD-10-CM | POA: Insufficient documentation

## 2021-03-31 DIAGNOSIS — F331 Major depressive disorder, recurrent, moderate: Secondary | ICD-10-CM | POA: Diagnosis not present

## 2021-03-31 MED ORDER — TRAZODONE HCL 150 MG PO TABS: 150.0000 mg | ORAL_TABLET | Freq: Every evening | ORAL | 3 refills | Status: DC | PRN

## 2021-03-31 MED ORDER — ESCITALOPRAM OXALATE 20 MG PO TABS: 20.0000 mg | ORAL_TABLET | Freq: Every day | ORAL | 3 refills | Status: DC

## 2021-03-31 NOTE — Progress Notes (Signed)
Attempted to contact patient at 12:00 PM. No answer, left a detail vm msg with direct call back information.

## 2021-03-31 NOTE — Progress Notes (Signed)
Telemedicine Visit via  Audio only - telephone (patient preference /  technical difficulty with MyChart video application)  I connected with Melanie Hogan on 03/31/21 at 1:08 PM  by phone or  telemedicine application as noted above  I verified that I am speaking with or regarding  the correct patient using two identifiers.  Participants: Myself, Dr Emeterio Reeve DO Patient: Melanie Hogan Patient proxy if applicable: none Other, if applicable: none  Patient is at home I am in office at [facility]    I discussed the limitations of evaluation and management  by telemedicine and the availability of in person appointments.  The participant(s) above expressed understanding and  agreed to proceed with this appointment via telemedicine.       History of Present Illness: Melanie Hogan is a 50 y.o. female who would like to discuss medication refills as below        Observations/Objective: BP 110/70   Temp 99.1 F (37.3 C)   Wt 135 lb (61.2 kg)   BMI 21.79 kg/m  BP Readings from Last 3 Encounters:  03/31/21 110/70  04/02/20 112/74  01/22/20 131/89   Exam: Normal Speech.  NAD  Lab and Radiology Results No results found for this or any previous visit (from the past 72 hour(s)). No results found.     Assessment and Plan: 50 y.o. female with The primary encounter diagnosis was ALS (amyotrophic lateral sclerosis) ([place]) - following w/ neurology . Diagnoses of GAD (generalized anxiety disorder) and Moderate episode of recurrent major depressive disorder ([place]) were also pertinent to this visit.  Refills sent Brief discussion of mammo/colon cancer screening, optional in patient w/ ALS, she will think about this. Pt appreciative of fewer medical appointments if possible, ok to see annually / prn     PDMP not reviewed this encounter. No orders of the defined types were placed in this encounter.  Meds ordered this encounter  Medications    escitalopram (LEXAPRO) 20 MG tablet    Sig: Take 1 tablet (20 mg total) by mouth at bedtime.    Dispense:  90 tablet    Refill:  3   traZODone (DESYREL) 150 MG tablet    Sig: Take 1 tablet (150 mg total) by mouth at bedtime as needed. for sleep    Dispense:  90 tablet    Refill:  3   There are no Patient Instructions on file for this visit.  Instructions sent via MyChart.   Follow Up Instructions: No follow-ups on file.    I discussed the assessment and treatment plan with the patient. The patient was provided an opportunity to ask questions and all were answered. The patient agreed with the plan and demonstrated an understanding of the instructions.   The patient was advised to call back or seek an in-person evaluation if any new concerns, if symptoms worsen or if the condition fails to improve as anticipated.  21 minutes of non-face-to-face time was provided during this encounter.      . . . . . . . . . . . . . Marland Kitchen                   Historical information moved to improve visibility of documentation.  Past Medical History:  Diagnosis Date   Anxiety    At high risk for falls    Depression    Migraines    Tobacco use disorder    Past Surgical History:  Procedure Laterality Date  ABDOMINAL HYSTERECTOMY     LAPAROSCOPIC NEPHRECTOMY Left 08/2016   donor   TUBAL LIGATION     Social History   Tobacco Use   Smoking status: Every Day    Packs/day: 0.50    Years: 20.00    Pack years: 10.00    Types: Cigarettes   Smokeless tobacco: Never  Substance Use Topics   Alcohol use: Not Currently    Comment: 1-2 / year   family history includes Cancer in her father; Healthy in her mother.  Medications: Current Outpatient Medications  Medication Sig Dispense Refill   baclofen (LIORESAL) 10 MG tablet Take 10 mg by mouth 3 (three) times daily.     diazepam (VALIUM) 5 MG tablet Take 5 mg by mouth 3 (three) times daily as needed.      tolterodine (DETROL LA) 2 MG 24 hr capsule Take 2 mg by mouth daily.     escitalopram (LEXAPRO) 20 MG tablet Take 1 tablet (20 mg total) by mouth at bedtime. 90 tablet 3   traZODone (DESYREL) 150 MG tablet Take 1 tablet (150 mg total) by mouth at bedtime as needed. for sleep 90 tablet 3   No current facility-administered medications for this visit.   Allergies  Allergen Reactions   Morphine Other (See Comments), Itching and Nausea And Vomiting    Per pt was also violent  N/V, ineffective   Riluzole Nausea Only     If phone visit, billing and coding can please add appropriate modifier if needed

## 2021-08-25 DIAGNOSIS — Z79899 Other long term (current) drug therapy: Secondary | ICD-10-CM | POA: Diagnosis not present

## 2021-08-25 DIAGNOSIS — G1220 Motor neuron disease, unspecified: Secondary | ICD-10-CM | POA: Diagnosis not present

## 2021-08-25 DIAGNOSIS — G9589 Other specified diseases of spinal cord: Secondary | ICD-10-CM | POA: Diagnosis not present

## 2021-09-06 ENCOUNTER — Telehealth: Payer: Self-pay

## 2021-09-06 ENCOUNTER — Other Ambulatory Visit: Payer: Self-pay

## 2021-09-06 DIAGNOSIS — F411 Generalized anxiety disorder: Secondary | ICD-10-CM

## 2021-09-06 DIAGNOSIS — F331 Major depressive disorder, recurrent, moderate: Secondary | ICD-10-CM

## 2021-09-06 MED ORDER — TRAZODONE HCL 150 MG PO TABS: 150.0000 mg | ORAL_TABLET | Freq: Every evening | ORAL | 3 refills | Status: DC | PRN

## 2021-09-06 MED ORDER — ESCITALOPRAM OXALATE 20 MG PO TABS: 20.0000 mg | ORAL_TABLET | Freq: Every day | ORAL | 3 refills | Status: DC

## 2021-09-06 NOTE — Telephone Encounter (Signed)
Spoke with pt who agreed to scheduled appt with Dr. Zigmund Daniel to establish care. She has ALS and states she is only able to do VV at this time. I told her that I could send in a #30 day supply of her escitalopram and trazodone to bridge her until she comes in for her appt with him. Call transferred to the front desk for scheduling.

## 2021-09-06 NOTE — Telephone Encounter (Signed)
[place] (1st attempt)    Pt LVM stating she needed refills of her meds. She is an [facility] pt and has not scheduled an OV to establish with a new PCP. I called and LVM for her to call back so we can discuss her re-establishment and Rx refills.

## 2021-09-07 ENCOUNTER — Telehealth (INDEPENDENT_AMBULATORY_CARE_PROVIDER_SITE_OTHER): Payer: Medicare HMO | Admitting: Family Medicine

## 2021-09-07 ENCOUNTER — Encounter: Payer: Self-pay | Admitting: Family Medicine

## 2021-09-07 DIAGNOSIS — F411 Generalized anxiety disorder: Secondary | ICD-10-CM | POA: Diagnosis not present

## 2021-09-07 DIAGNOSIS — F331 Major depressive disorder, recurrent, moderate: Secondary | ICD-10-CM | POA: Diagnosis not present

## 2021-09-07 DIAGNOSIS — R69 Illness, unspecified: Secondary | ICD-10-CM | POA: Diagnosis not present

## 2021-09-07 NOTE — Assessment & Plan Note (Signed)
Symptoms are well controlled with Lexapro at current strength.  Sleeping well with trazodone.  We will continue these medications at this time.

## 2021-09-07 NOTE — Progress Notes (Signed)
Melanie Hogan - 51 y.o. female MRN [id number]  Date of birth: [birth year]   This visit type was conducted due to national recommendations for restrictions regarding the COVID-19 Pandemic (e.g. social distancing).  This format is felt to be most appropriate for this patient at this time.  All issues noted in this document were discussed and addressed.  No physical exam was performed (except for noted visual exam findings with Video Visits).  I discussed the limitations of evaluation and management by telemedicine and the availability of in person appointments. The patient expressed understanding and agreed to proceed.  I connected withNAME@ on 09/07/21 at  2:50 PM EST by a video enabled telemedicine application and verified that I am speaking with the correct person using two identifiers.  Present at visit: Melanie Nutting, DO [name]   Patient Location: [facility] [address] [place] [address]   Provider location:   [place]  Chief Complaint  Patient presents with   Transitions Of Care    HPI  Melanie Hogan is a 51 y.o. female who presents via audio/video conferencing for a telehealth visit today.  She is following up for depression and anxiety as well as insomnia.  She has history of ALS and is followed by a specialist through [facility].  Unfortunately this has not been responsive to traditional treatments.  She does feel like Lexapro continues to work well for her.  She is sleeping well with trazodone.  Additionally she is taking Valium and baclofen which is being managed by her neurologist.   ROS:  A comprehensive ROS was completed and negative except as noted per HPI  Past Medical History:  Diagnosis Date   Anxiety    At high risk for falls    Depression    Migraines    Tobacco use disorder     Past Surgical History:  Procedure Laterality Date   ABDOMINAL HYSTERECTOMY     LAPAROSCOPIC NEPHRECTOMY Left 08/2016   donor   TUBAL LIGATION       Family History  Problem Relation Age of Onset   Healthy Mother    Cancer Father     Social History   Socioeconomic History   Marital status: Single    Spouse name: Not on file   Number of children: Not on file   Years of education: Not on file   Highest education level: Not on file  Occupational History   Not on file  Tobacco Use   Smoking status: Every Day    Packs/day: 0.50    Years: 20.00    Pack years: 10.00    Types: Cigarettes   Smokeless tobacco: Never  Vaping Use   Vaping Use: Never used  Substance and Sexual Activity   Alcohol use: Not Currently    Comment: 1-2 / year   Drug use: Not Currently   Sexual activity: Not Currently    Birth control/protection: Abstinence, Surgical  Other Topics Concern   Not on file  Social History Narrative   Not on file   Social Determinants of Health   Financial Resource Strain: Not on file  Food Insecurity: Not on file  Transportation Needs: Not on file  Physical Activity: Not on file  Stress: Not on file  Social Connections: Not on file  Intimate Partner Violence: Not on file     Current Outpatient Medications:    baclofen (LIORESAL) 10 MG tablet, Take 10 mg by mouth 3 (three) times daily., Disp: , Rfl:  diazepam (VALIUM) 5 MG tablet, Take 5 mg by mouth 3 (three) times daily as needed., Disp: , Rfl:    escitalopram (LEXAPRO) 20 MG tablet, Take 1 tablet (20 mg total) by mouth at bedtime., Disp: 90 tablet, Rfl: 3   traZODone (DESYREL) 150 MG tablet, Take 1 tablet (150 mg total) by mouth at bedtime as needed. for sleep, Disp: 90 tablet, Rfl: 3  EXAM:  VITALS per patient if applicable: Ht 5\' 6"  (1.676 m)    Wt 135 lb (61.2 kg)    BMI 21.79 kg/m   GENERAL: alert, oriented, appears well and in no acute distress  HEENT: atraumatic, conjunttiva clear, no obvious abnormalities on inspection of external nose and ears  NECK: normal movements of the head and neck  LUNGS: on inspection no signs of respiratory  distress, breathing rate appears normal, no obvious gross SOB, gasping or wheezing  CV: no obvious cyanosis  MS: moves all visible extremities without noticeable abnormality  PSYCH/NEURO: pleasant and cooperative, no obvious depression or anxiety, speech and thought processing grossly intact  ASSESSMENT AND PLAN:  Discussed the following assessment and plan:  GAD (generalized anxiety disorder) Symptoms are well controlled with Lexapro at current strength.  Sleeping well with trazodone.  We will continue these medications at this time.     I discussed the assessment and treatment plan with the patient. The patient was provided an opportunity to ask questions and all were answered. The patient agreed with the plan and demonstrated an understanding of the instructions.   The patient was advised to call back or seek an in-person evaluation if the symptoms worsen or if the condition fails to improve as anticipated.    Melanie Nutting, DO

## 2022-02-23 DIAGNOSIS — Z79899 Other long term (current) drug therapy: Secondary | ICD-10-CM | POA: Diagnosis not present

## 2022-02-23 DIAGNOSIS — Z7409 Other reduced mobility: Secondary | ICD-10-CM | POA: Diagnosis not present

## 2022-02-23 DIAGNOSIS — G1221 Amyotrophic lateral sclerosis: Secondary | ICD-10-CM | POA: Diagnosis not present

## 2022-03-31 DIAGNOSIS — G1221 Amyotrophic lateral sclerosis: Secondary | ICD-10-CM | POA: Diagnosis not present

## 2022-03-31 DIAGNOSIS — R918 Other nonspecific abnormal finding of lung field: Secondary | ICD-10-CM | POA: Diagnosis not present

## 2022-03-31 DIAGNOSIS — R519 Headache, unspecified: Secondary | ICD-10-CM | POA: Diagnosis not present

## 2022-03-31 DIAGNOSIS — Z993 Dependence on wheelchair: Secondary | ICD-10-CM | POA: Diagnosis not present

## 2022-03-31 DIAGNOSIS — G1220 Motor neuron disease, unspecified: Secondary | ICD-10-CM | POA: Diagnosis not present

## 2022-03-31 DIAGNOSIS — R220 Localized swelling, mass and lump, head: Secondary | ICD-10-CM | POA: Diagnosis not present

## 2022-03-31 DIAGNOSIS — Z79899 Other long term (current) drug therapy: Secondary | ICD-10-CM | POA: Diagnosis not present

## 2022-03-31 DIAGNOSIS — Z5329 Procedure and treatment not carried out because of patient's decision for other reasons: Secondary | ICD-10-CM | POA: Diagnosis not present

## 2022-03-31 DIAGNOSIS — Z85820 Personal history of malignant melanoma of skin: Secondary | ICD-10-CM | POA: Diagnosis not present

## 2022-03-31 DIAGNOSIS — G930 Cerebral cysts: Secondary | ICD-10-CM | POA: Diagnosis not present

## 2022-03-31 DIAGNOSIS — C4359 Malignant melanoma of other part of trunk: Secondary | ICD-10-CM | POA: Diagnosis not present

## 2022-03-31 DIAGNOSIS — D7389 Other diseases of spleen: Secondary | ICD-10-CM | POA: Diagnosis not present

## 2022-03-31 DIAGNOSIS — R935 Abnormal findings on diagnostic imaging of other abdominal regions, including retroperitoneum: Secondary | ICD-10-CM | POA: Diagnosis not present

## 2022-03-31 DIAGNOSIS — G936 Cerebral edema: Secondary | ICD-10-CM | POA: Diagnosis not present

## 2022-03-31 DIAGNOSIS — R69 Illness, unspecified: Secondary | ICD-10-CM | POA: Diagnosis not present

## 2022-04-01 ENCOUNTER — Other Ambulatory Visit: Payer: Self-pay | Admitting: Radiation Therapy

## 2022-04-01 ENCOUNTER — Inpatient Hospital Stay (HOSPITAL_COMMUNITY): Payer: Medicare HMO

## 2022-04-01 ENCOUNTER — Other Ambulatory Visit: Payer: Self-pay

## 2022-04-01 ENCOUNTER — Inpatient Hospital Stay (HOSPITAL_COMMUNITY)
Admission: EM | Admit: 2022-04-01 | Discharge: 2022-04-06 | DRG: 054 | Disposition: A | Discharge status: Home / Self Care | Payer: Medicare HMO | Attending: Internal Medicine | Admitting: Internal Medicine

## 2022-04-01 ENCOUNTER — Encounter (HOSPITAL_COMMUNITY): Payer: Self-pay | Admitting: Emergency Medicine

## 2022-04-01 DIAGNOSIS — Z885 Allergy status to narcotic agent status: Secondary | ICD-10-CM | POA: Diagnosis not present

## 2022-04-01 DIAGNOSIS — R262 Difficulty in walking, not elsewhere classified: Secondary | ICD-10-CM | POA: Diagnosis not present

## 2022-04-01 DIAGNOSIS — C7931 Secondary malignant neoplasm of brain: Secondary | ICD-10-CM | POA: Diagnosis not present

## 2022-04-01 DIAGNOSIS — C801 Malignant (primary) neoplasm, unspecified: Secondary | ICD-10-CM | POA: Diagnosis not present

## 2022-04-01 DIAGNOSIS — C7802 Secondary malignant neoplasm of left lung: Secondary | ICD-10-CM | POA: Diagnosis not present

## 2022-04-01 DIAGNOSIS — C7801 Secondary malignant neoplasm of right lung: Secondary | ICD-10-CM | POA: Diagnosis not present

## 2022-04-01 DIAGNOSIS — Z993 Dependence on wheelchair: Secondary | ICD-10-CM | POA: Diagnosis not present

## 2022-04-01 DIAGNOSIS — R918 Other nonspecific abnormal finding of lung field: Secondary | ICD-10-CM | POA: Diagnosis not present

## 2022-04-01 DIAGNOSIS — Z808 Family history of malignant neoplasm of other organs or systems: Secondary | ICD-10-CM | POA: Diagnosis not present

## 2022-04-01 DIAGNOSIS — Z85820 Personal history of malignant melanoma of skin: Secondary | ICD-10-CM

## 2022-04-01 DIAGNOSIS — Z801 Family history of malignant neoplasm of trachea, bronchus and lung: Secondary | ICD-10-CM | POA: Diagnosis not present

## 2022-04-01 DIAGNOSIS — Z66 Do not resuscitate: Secondary | ICD-10-CM | POA: Diagnosis not present

## 2022-04-01 DIAGNOSIS — C4490 Unspecified malignant neoplasm of skin, unspecified: Secondary | ICD-10-CM | POA: Diagnosis not present

## 2022-04-01 DIAGNOSIS — R54 Age-related physical debility: Secondary | ICD-10-CM | POA: Diagnosis not present

## 2022-04-01 DIAGNOSIS — G1221 Amyotrophic lateral sclerosis: Secondary | ICD-10-CM | POA: Diagnosis not present

## 2022-04-01 DIAGNOSIS — G936 Cerebral edema: Secondary | ICD-10-CM | POA: Diagnosis not present

## 2022-04-01 DIAGNOSIS — G9389 Other specified disorders of brain: Secondary | ICD-10-CM | POA: Diagnosis not present

## 2022-04-01 DIAGNOSIS — R590 Localized enlarged lymph nodes: Secondary | ICD-10-CM | POA: Diagnosis not present

## 2022-04-01 DIAGNOSIS — M4854XA Collapsed vertebra, not elsewhere classified, thoracic region, initial encounter for fracture: Secondary | ICD-10-CM | POA: Diagnosis not present

## 2022-04-01 DIAGNOSIS — C4359 Malignant melanoma of other part of trunk: Secondary | ICD-10-CM | POA: Diagnosis present

## 2022-04-01 DIAGNOSIS — F411 Generalized anxiety disorder: Secondary | ICD-10-CM | POA: Diagnosis present

## 2022-04-01 DIAGNOSIS — R0602 Shortness of breath: Secondary | ICD-10-CM | POA: Diagnosis not present

## 2022-04-01 DIAGNOSIS — Z79899 Other long term (current) drug therapy: Secondary | ICD-10-CM

## 2022-04-01 DIAGNOSIS — Z888 Allergy status to other drugs, medicaments and biological substances status: Secondary | ICD-10-CM | POA: Diagnosis not present

## 2022-04-01 DIAGNOSIS — R519 Headache, unspecified: Secondary | ICD-10-CM | POA: Diagnosis not present

## 2022-04-01 DIAGNOSIS — G935 Compression of brain: Secondary | ICD-10-CM

## 2022-04-01 DIAGNOSIS — G43909 Migraine, unspecified, not intractable, without status migrainosus: Secondary | ICD-10-CM | POA: Diagnosis not present

## 2022-04-01 DIAGNOSIS — C439 Malignant melanoma of skin, unspecified: Secondary | ICD-10-CM | POA: Diagnosis not present

## 2022-04-01 DIAGNOSIS — F418 Other specified anxiety disorders: Secondary | ICD-10-CM | POA: Diagnosis not present

## 2022-04-01 DIAGNOSIS — Z515 Encounter for palliative care: Secondary | ICD-10-CM | POA: Diagnosis not present

## 2022-04-01 DIAGNOSIS — F17210 Nicotine dependence, cigarettes, uncomplicated: Secondary | ICD-10-CM | POA: Diagnosis not present

## 2022-04-01 DIAGNOSIS — R471 Dysarthria and anarthria: Secondary | ICD-10-CM | POA: Diagnosis not present

## 2022-04-01 DIAGNOSIS — R220 Localized swelling, mass and lump, head: Secondary | ICD-10-CM | POA: Diagnosis not present

## 2022-04-01 DIAGNOSIS — R5383 Other fatigue: Secondary | ICD-10-CM | POA: Diagnosis not present

## 2022-04-01 DIAGNOSIS — N289 Disorder of kidney and ureter, unspecified: Secondary | ICD-10-CM | POA: Diagnosis not present

## 2022-04-01 DIAGNOSIS — Z7401 Bed confinement status: Secondary | ICD-10-CM

## 2022-04-01 DIAGNOSIS — G930 Cerebral cysts: Secondary | ICD-10-CM | POA: Diagnosis not present

## 2022-04-01 DIAGNOSIS — D7389 Other diseases of spleen: Secondary | ICD-10-CM | POA: Diagnosis not present

## 2022-04-01 DIAGNOSIS — R935 Abnormal findings on diagnostic imaging of other abdominal regions, including retroperitoneum: Secondary | ICD-10-CM | POA: Diagnosis not present

## 2022-04-01 DIAGNOSIS — G309 Alzheimer's disease, unspecified: Secondary | ICD-10-CM | POA: Diagnosis not present

## 2022-04-01 DIAGNOSIS — Z5329 Procedure and treatment not carried out because of patient's decision for other reasons: Secondary | ICD-10-CM | POA: Diagnosis not present

## 2022-04-01 DIAGNOSIS — G1220 Motor neuron disease, unspecified: Secondary | ICD-10-CM | POA: Diagnosis not present

## 2022-04-01 DIAGNOSIS — F331 Major depressive disorder, recurrent, moderate: Secondary | ICD-10-CM | POA: Diagnosis present

## 2022-04-01 DIAGNOSIS — G939 Disorder of brain, unspecified: Secondary | ICD-10-CM | POA: Diagnosis not present

## 2022-04-01 DIAGNOSIS — R69 Illness, unspecified: Secondary | ICD-10-CM | POA: Diagnosis not present

## 2022-04-01 DIAGNOSIS — Z7189 Other specified counseling: Secondary | ICD-10-CM | POA: Diagnosis not present

## 2022-04-01 HISTORY — DX: Amyotrophic lateral sclerosis: G12.21

## 2022-04-01 LAB — COMPREHENSIVE METABOLIC PANEL
ALT: 15 U/L (ref 0–44)
AST: 19 U/L (ref 15–41)
Albumin: 3.8 g/dL (ref 3.5–5.0)
Alkaline Phosphatase: 70 U/L (ref 38–126)
Anion gap: 11 (ref 5–15)
BUN: 16 mg/dL (ref 6–20)
CO2: 21 mmol/L — ABNORMAL LOW (ref 22–32)
Calcium: 9.9 mg/dL (ref 8.9–10.3)
Chloride: 109 mmol/L (ref 98–111)
Creatinine, Ser: 1.04 mg/dL — ABNORMAL HIGH (ref 0.44–1.00)
GFR, Estimated: >60 mL/min (ref >60)
Glucose, Bld: 126 mg/dL — ABNORMAL HIGH (ref 70–99)
Potassium: 4.2 mmol/L (ref 3.5–5.1)
Sodium: 141 mmol/L (ref 135–145)
Total Bilirubin: 0.4 mg/dL (ref 0.3–1.2)
Total Protein: 6.4 g/dL — ABNORMAL LOW (ref 6.5–8.1)

## 2022-04-01 LAB — CBC WITH DIFFERENTIAL/PLATELET
Abs Immature Granulocytes: 0.03 10*3/uL (ref 0.00–0.07)
Basophils Absolute: 0 10*3/uL (ref 0.0–0.1)
Basophils Relative: 0 %
Eosinophils Absolute: 0 10*3/uL (ref 0.0–0.5)
Eosinophils Relative: 0 %
HCT: 37.6 % (ref 36.0–46.0)
Hemoglobin: 12.8 g/dL (ref 12.0–15.0)
Immature Granulocytes: 1 %
Lymphocytes Relative: 21 %
Lymphs Abs: 1.1 10*3/uL (ref 0.7–4.0)
MCH: 31.1 pg (ref 26.0–34.0)
MCHC: 34 g/dL (ref 30.0–36.0)
MCV: 91.3 fL (ref 80.0–100.0)
Monocytes Absolute: 0.1 10*3/uL (ref 0.1–1.0)
Monocytes Relative: 2 %
Neutro Abs: 4.2 10*3/uL (ref 1.7–7.7)
Neutrophils Relative %: 76 %
Platelets: 233 10*3/uL (ref 150–400)
RBC: 4.12 MIL/uL (ref 3.87–5.11)
RDW: 13.1 % (ref 11.5–15.5)
WBC: 5.5 10*3/uL (ref 4.0–10.5)
nRBC: 0 % (ref 0.0–0.2)

## 2022-04-01 MED ORDER — LEVETIRACETAM 500 MG PO TABS
500.0000 mg | ORAL_TABLET | Freq: Two times a day (BID) | ORAL | Status: DC
  Administered 2022-04-01 – 2022-04-06 (×11): 500 mg via ORAL
  Filled 2022-04-01 (×11): qty 1

## 2022-04-01 MED ORDER — MAGNESIUM CITRATE PO SOLN: 1.0000 | Freq: Once | ORAL | Status: DC | PRN

## 2022-04-01 MED ORDER — ENSURE ENLIVE PO LIQD: 237.0000 mL | Freq: Two times a day (BID) | ORAL | Status: DC

## 2022-04-01 MED ORDER — IPRATROPIUM-ALBUTEROL 0.5-2.5 (3) MG/3ML IN SOLN: 3.0000 mL | RESPIRATORY_TRACT | Status: DC | PRN

## 2022-04-01 MED ORDER — DEXAMETHASONE SODIUM PHOSPHATE 4 MG/ML IJ SOLN
4.0000 mg | Freq: Four times a day (QID) | INTRAMUSCULAR | Status: DC
  Administered 2022-04-01 – 2022-04-06 (×20): 4 mg via INTRAVENOUS
  Filled 2022-04-01 (×21): qty 1

## 2022-04-01 MED ORDER — ESCITALOPRAM OXALATE 20 MG PO TABS
20.0000 mg | ORAL_TABLET | Freq: Every day | ORAL | Status: DC
  Administered 2022-04-01 – 2022-04-05 (×5): 20 mg via ORAL
  Filled 2022-04-01 (×4): qty 2
  Filled 2022-04-01: qty 1

## 2022-04-01 MED ORDER — ONDANSETRON HCL 4 MG/2ML IJ SOLN: 4.0000 mg | Freq: Four times a day (QID) | INTRAMUSCULAR | Status: DC | PRN

## 2022-04-01 MED ORDER — DIAZEPAM 5 MG PO TABS
5.0000 mg | ORAL_TABLET | Freq: Three times a day (TID) | ORAL | Status: DC | PRN
  Administered 2022-04-01 – 2022-04-06 (×9): 5 mg via ORAL
  Filled 2022-04-01 (×11): qty 1

## 2022-04-01 MED ORDER — BACLOFEN 10 MG PO TABS
10.0000 mg | ORAL_TABLET | Freq: Three times a day (TID) | ORAL | Status: DC
  Administered 2022-04-01 – 2022-04-06 (×15): 10 mg via ORAL
  Filled 2022-04-01 (×15): qty 1

## 2022-04-01 MED ORDER — DEXAMETHASONE SODIUM PHOSPHATE 4 MG/ML IJ SOLN: 4.0000 mg | Freq: Four times a day (QID) | INTRAMUSCULAR | Status: DC

## 2022-04-01 MED ORDER — PANTOPRAZOLE SODIUM 20 MG PO TBEC
20.0000 mg | DELAYED_RELEASE_TABLET | Freq: Every day | ORAL | Status: DC
  Administered 2022-04-01 – 2022-04-06 (×5): 20 mg via ORAL
  Filled 2022-04-01 (×5): qty 1

## 2022-04-01 MED ORDER — BISACODYL 5 MG PO TBEC
5.0000 mg | DELAYED_RELEASE_TABLET | Freq: Every day | ORAL | Status: DC | PRN
  Administered 2022-04-03: 5 mg via ORAL
  Filled 2022-04-01: qty 1

## 2022-04-01 MED ORDER — TRAZODONE HCL 50 MG PO TABS
150.0000 mg | ORAL_TABLET | Freq: Every evening | ORAL | Status: DC | PRN
  Administered 2022-04-01 – 2022-04-05 (×5): 150 mg via ORAL
  Filled 2022-04-01 (×2): qty 1
  Filled 2022-04-01: qty 3
  Filled 2022-04-01 (×2): qty 1

## 2022-04-01 MED ORDER — BUTALBITAL-APAP-CAFFEINE 50-325-40 MG PO TABS
1.0000 | ORAL_TABLET | ORAL | Status: DC | PRN
  Administered 2022-04-01 – 2022-04-05 (×2): 1 via ORAL
  Filled 2022-04-01 (×3): qty 1

## 2022-04-01 NOTE — H&P (Signed)
History and Physical    Melanie Hogan QQV:[id number] DOB: April 08, 1971 DOA: 04/01/2022  PCP: Pcp, No (Confirm with patient/family/NH records and if not entered, this has to be entered at [facility] point of entry) Patient coming from: Home  I have personally briefly reviewed patient's old medical records in [place]  Chief Complaint: Headache, shortness of breath, leg weakness and tremors  HPI: Melanie Hogan is a 51 y.o. female with medical history significant of ALS, anxiety/depression, melanoma on back status post resection, chronic thoracic vertebral compression fracture, presented with worsening of headaches and ambulation dysfunction.  Patient at baseline has a chronic ambulation dysfunction appears to be started as early as 05/2020, symptoms including bilateral leg weakness right> left, muscle spasm and tremors of lower extremities,. Since then she been using a walker and she quit her job since then as well.  She went to see neurology at that time, work-up including CSF study within normal limits however diagnosis was proximal weakness suspicious for ALS.  Patient was on treatment trial for ALS but failed due to GI upset.  She has been following with neurology and physical therapy, at baseline, she uses walker with some foot drop right>left.  Recently, she started to develop shortness of breath over the last 45-month she went to see neurology, and was told that ALS might have progressed and neurology recommended started on a new medication Redicava.  Her lung function/FVC however as of this month appears to be within normal limits.  Last 2 weeks however her tremors and weakness has been worse, she could no longer use her walker.  Meantime, she also developed a global headache with occasional nausea, dull ache, no vision changes no hearing changes.  And she went to see neurology again last week who ordered a CT head.  [facility]  with edema suspicious for metastatic lesions along with midline shift.  [facility] CT abdomen chest which showed multiple metastatic lesions in her lungs as well as hilar nodes.   ED Course: Afebrile, no tachycardia nonhypotensive afebrile.  Not hypoxic.  Breathing rate 12/min.  CBC CMP largely normal.  Neurosurgery consulted, recommended MRI with and without contrast.  Review of Systems: As per HPI otherwise 14 point review of systems negative.    Past Medical History:  Diagnosis Date   [place]    Anxiety    At high risk for falls    Depression    Migraines    Tobacco use disorder     Past Surgical History:  Procedure Laterality Date   ABDOMINAL HYSTERECTOMY     LAPAROSCOPIC NEPHRECTOMY Left 08/2016   donor   TUBAL LIGATION       reports that she has been smoking cigarettes. She has a 10.00 pack-year smoking history. She has never used smokeless tobacco. She reports that she does not currently use alcohol. She reports that she does not currently use drugs.  Allergies  Allergen Reactions   Morphine Other (See Comments), Itching and Nausea And Vomiting    Per pt was also violent  N/V, ineffective   Riluzole Nausea Only    Family History  Problem Relation Age of Onset   Healthy Mother    Cancer Father      Prior to Admission medications   Medication Sig Start Date End Date Taking? Authorizing Provider  baclofen (LIORESAL) 10 MG tablet Take 10 mg by  mouth 3 (three) times daily. 01/31/21   [provider]  diazepam (VALIUM) 5 MG tablet Take 5 mg by mouth 3 (three) times daily as needed. 03/10/21   [provider]  escitalopram (LEXAPRO) 20 MG tablet Take 1 tablet (20 mg total) by mouth at bedtime. 09/06/21   Breeback, Jade L, PA-C  traZODone (DESYREL) 150 MG tablet Take 1 tablet (150 mg total) by mouth at bedtime as needed. for sleep 09/06/21   Donella Stade, PA-C    Physical  Exam: Vitals:   04/01/22 1300 04/01/22 1330 04/01/22 1400 04/01/22 1415  BP: 102/64 135/83 115/81 109/61  Pulse: 60 83 70 79  Resp: '15 18 18 20  '$ Temp:      TempSrc:      SpO2: 95% 95% 93% 93%  Weight:      Height:        Constitutional: NAD, calm, comfortable Vitals:   04/01/22 1300 04/01/22 1330 04/01/22 1400 04/01/22 1415  BP: 102/64 135/83 115/81 109/61  Pulse: 60 83 70 79  Resp: '15 18 18 20  '$ Temp:      TempSrc:      SpO2: 95% 95% 93% 93%  Weight:      Height:       Eyes: PERRL, lids and conjunctivae normal ENMT: Mucous membranes are dry. Posterior pharynx clear of any exudate or lesions.Normal dentition.  Neck: normal, supple, no masses, no thyromegaly Respiratory: clear to auscultation bilaterally, no wheezing, no crackles. Normal respiratory effort. No accessory muscle use.  Cardiovascular: Regular rate and rhythm, no murmurs / rubs / gallops. No extremity edema. 2+ pedal pulses. No carotid bruits.  Abdomen: no tenderness, no masses palpated. No hepatosplenomegaly. Bowel sounds positive.  Musculoskeletal: no clubbing / cyanosis. No joint deformity upper and lower extremities. Good ROM, no contractures. Normal muscle tone.  Skin: no rashes, lesions, ulcers. No induration Neurologic: CN 2-12 grossly intact. Sensation intact, DTR normal.  Foot drop and muscle strength 4/5 of lower extremity abduction, coarse temors on B/L legs and creased muscle tone of bilateral lower extremities psychiatric: Normal judgment and insight. Alert and oriented x 3. Normal mood.     Labs on Admission: I have personally reviewed following labs and imaging studies  CBC: Recent Labs  Lab 04/01/22 1256  WBC 5.5  NEUTROABS 4.2  HGB 12.8  HCT 37.6  MCV 91.3  PLT 474   Basic Metabolic Panel: Recent Labs  Lab 04/01/22 1256  NA 141  K 4.2  CL 109  CO2 21*  GLUCOSE 126*  BUN 16  CREATININE 1.04*  CALCIUM 9.9   GFR: Estimated Creatinine Clearance: 56.2 mL/min (A) (by C-G formula  based on SCr of 1.04 mg/dL (H)). Liver Function Tests: Recent Labs  Lab 04/01/22 1256  AST 19  ALT 15  ALKPHOS 70  BILITOT 0.4  PROT 6.4*  ALBUMIN 3.8   No results for input(s): "LIPASE", "AMYLASE" in the last 168 hours. No results for input(s): "AMMONIA" in the last 168 hours. Coagulation Profile: No results for input(s): "INR", "PROTIME" in the last 168 hours. Cardiac Enzymes: No results for input(s): "CKTOTAL", "CKMB", "CKMBINDEX", "TROPONINI" in the last 168 hours. BNP (last 3 results) No results for input(s): "PROBNP" in the last 8760 hours. HbA1C: No results for input(s): "HGBA1C" in the last 72 hours. CBG: No results for input(s): "GLUCAP" in the last 168 hours. Lipid Profile: No results for input(s): "CHOL", "HDL", "LDLCALC", "TRIG", "CHOLHDL", "LDLDIRECT" in the last 72 hours. Thyroid Function Tests: No results  for input(s): "TSH", "T4TOTAL", "FREET4", "T3FREE", "THYROIDAB" in the last 72 hours. Anemia Panel: No results for input(s): "VITAMINB12", "FOLATE", "FERRITIN", "TIBC", "IRON", "RETICCTPCT" in the last 72 hours. Urine analysis:    Component Value Date/Time   COLORURINE YELLOW 09/05/2018 [place] 09/05/2018 1534   LABSPEC 1.007 09/05/2018 1534   PHURINE 5.5 09/05/2018 1534   GLUCOSEU NEGATIVE 09/05/2018 1534   HGBUR NEGATIVE 09/05/2018 1534   KETONESUR NEGATIVE 09/05/2018 1534   PROTEINUR NEGATIVE 09/05/2018 1534   NITRITE NEGATIVE 09/05/2018 [place] 09/05/2018 1534    Radiological Exams on Admission: No results found.  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 (For example, if patient is on BP meds at home and you resume or decide to hold them, it is a problem that needs to be her. Same for CAD, COPD, HLD and so on)  Metastatic brain lesions with mass effect and midline  shift -CT head [facility] showed multiple round lesions with vasogenic edema largest lesion located in the right temporal lobe with edema associated with significant mass effect and 5 mm leftward midline shift and medialization of the right uncus. -Stat MRI of the brain with and without contrast -Neurosurgery on board -Continue Decadron 4 mg every 6 hours, add Keppra for prophylactic seizure treatment -Neurochecks, currently there is no significant signs of intracranial hypertension. -Discussed with on-call oncology Dr. Benay Spice, who recommend radiation oncology consult and outpatient follow-up with melanoma specialist Dr. Alen Blew.  Radiation oncology paged. -Avoid chemical DVT prophylaxis given the likelihood of metastatic melanoma.  Acute on chronic ambulation dysfunction -As of now, it is hard to differentiate whether the increasing motor weakness secondary to brain mets versus worsening of baseline ALS. -Family reported that patient had hard time to transfer self from bed to chair in last 2 weeks, will consult inpatient PT. -Monitor FVC, start incentive spirometry  Anxiety/depression -Continue SSRI  Recurrent melanoma -With significant lung and brain metastasis -Long discussion with patient and her brother at bedside, patient does not want to go back oncology at [facility]. As per oncall oncologist's recommendation, outpatient follow-up with Dr. Alen Blew.   DVT prophylaxis: SCD Code Status: Full code Family Communication: brother at bedside Disposition Plan: Patient is sick with intracranial metastatic lesions with mass effect and midline shift, may need neurosurgery intervention, expect more than 2 midnight hospital stay Consults called: Neurosurgery Admission status: Telemetry admission   Lequita Halt MD [facility] Pager [phone]  04/01/2022, 2:49 PM

## 2022-04-01 NOTE — Progress Notes (Signed)
Patient was brought down to MRI department to scan an [place]  Patient is suffering from [place].  Patient was unable to put her head down without elevating the head coil.  We also had to elevate her pelvis to try to help her put her head back.  Began to scan but patient started to move her feet to signal [place] that she wanted to be done.  When we pulled her out, she continued to state that she needed help.  Patients back was drenched when we pulled her back over to her bead.  04/01/22 @ 1906 tmh

## 2022-04-01 NOTE — Progress Notes (Deleted)
Patient brought to MRI  Patient has ALS and an SRS was ordered.  Patient could not lay her head flat.  Patient was teapotted but this did not resolve the issue.  Patients head was elevated with the coil.  Patient kept stating that she hurt.  Started to scan patient and patient started  moving her feet to signal [place] that she wanted to be done.  Patient was pulled out and kept saying repeatedly, I need help.  Patients back was drenched.  04/01/22 @ 1910

## 2022-04-01 NOTE — ED Provider Notes (Signed)
[facility] EMERGENCY DEPARTMENT Provider Note   CSN: [id number] Arrival date & time: 04/01/22  1114     History  Chief Complaint  Patient presents with   CT Abnormalities    Melanie Hogan is a 51 y.o. female.  HPI   51 year old female presents to the emergency department with concern for imaging abnormalities.  Patient has known ALS for the past 3 years.  For the past 2 to 3 weeks she has had this persistent headache that was evaluated by her neurologist Dr. Tillman Abide who ordered a CT imaging of her head yesterday.  CT imaging concern for multiple lesions with surrounding edema with left midline shift.  More detailed CT imaging depicted in paragraph below.  Patient then went to the emergency department yesterday where she had a CT abdomen pelvis performed.  The CT scan was concerning for numerous pulmonary nodules concerning for metastasis, irregular left hilar adenopathy concerning for metastasis and indeterminate hyperenhancing foci within the spleen.  Patient states that headache has been constant for the past 2 to 3 weeks.  It is worse with lying flat and with quick movements.  She states that when she lies flat she becomes diaphoretic and the pain intensifies.  She left the emergency department [place] because "it was taking too long".  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  Patient states that she had a concerning spot of melanoma removed from her back 3 years ago as concern for origin of potential metastasis.  CT head from 03/31/2022: Multiple rounded lesions with peripheral hyperattenuation and central hypoattenuation. Largest lesion is in the right temporal lobe, measuring 2.4 x 2.1 cm.  Additional lesions are present in the anterior, inferior right temporal lobe, in the high right parietal lobe (measuring approximately 1.7 cm), and in the left posterior cingulate gyrus (measuring approximately 1.3 cm). Lesions are associated with large amounts of adjacent vasogenic edema. There is a large amount of vasogenic edema extending around and superior to the right temporal lesion with associated with mass effect and approximately 5 mm of right to left midline shift. There is medialization of the right uncus with early mass effect on the midbrain. Prominence of the left temporal horn is concerning for early trapping associated with midline shift. The hyperattenuation along the periphery of the lesions may relate to cellularity or blood products. No acute large vascular territory infarct. Rounded hypoattenuating area in the peripheral posterior cerebellum, without associated adjacent edema.   To see imaging performed at [facility], search [id number] in [place]  Past medical history significant for ALS, anxiety, depression, migraines, tobacco use disorder, generalized anxiety disorder, malignant melanoma  Home Medications Prior to Admission medications   Medication Sig Start Date End Date Taking? Authorizing Provider  baclofen (LIORESAL) 10 MG tablet Take 10 mg by mouth 3 (three) times daily. 01/31/21   [provider]  diazepam (VALIUM) 5 MG tablet Take 5 mg by mouth 3 (three) times daily as needed. 03/10/21   [provider]  escitalopram (LEXAPRO) 20 MG tablet Take 1 tablet (20 mg total) by mouth at bedtime. 09/06/21   Breeback, Jade L, PA-C  traZODone (DESYREL) 150 MG tablet Take 1 tablet (150 mg total) by mouth at bedtime as needed. for sleep 09/06/21  Breeback, Jade L, PA-C      Allergies    Morphine and Riluzole    Review of Systems   Review of Systems  All other systems reviewed and are negative.   Physical Exam Updated Vital Signs BP 109/61   Pulse 79    Temp 98.5 F (36.9 C) (Oral)   Resp 20   Ht '5\' 6"'$  (1.676 m)   Wt 54.4 kg   SpO2 93%   BMI 19.37 kg/m  Physical Exam Vitals and nursing note reviewed.  Constitutional:      General: She is not in acute distress.    Appearance: She is well-developed. She is diaphoretic.  HENT:     Head: Normocephalic and atraumatic.     Right Ear: Tympanic membrane normal.     Left Ear: Tympanic membrane normal.     Mouth/Throat:     Mouth: Mucous membranes are moist.     Pharynx: Oropharynx is clear.  Eyes:     Extraocular Movements: Extraocular movements intact.     Conjunctiva/sclera: Conjunctivae normal.     Pupils: Pupils are equal, round, and reactive to light.  Cardiovascular:     Rate and Rhythm: Normal rate and regular rhythm.     Pulses: Normal pulses.     Heart sounds: No murmur heard. Pulmonary:     Effort: Pulmonary effort is normal. No respiratory distress.     Breath sounds: Normal breath sounds.  Abdominal:     Palpations: Abdomen is soft.     Tenderness: There is no abdominal tenderness. There is no guarding.  Musculoskeletal:        General: No swelling.     Cervical back: Normal range of motion and neck supple. No rigidity or tenderness.     Right lower leg: No edema.     Left lower leg: No edema.  Skin:    General: Skin is warm.     Capillary Refill: Capillary refill takes less than 2 seconds.  Neurological:     Mental Status: She is alert and oriented to person, place, and time.     GCS: GCS eye subscore is 4. GCS verbal subscore is 5. GCS motor subscore is 6.     Cranial Nerves: No facial asymmetry.     Motor: No pronator drift.     Coordination: Finger-Nose-Finger Test normal.     Comments: Patient expressing dysarthria which is slightly increased from baseline per patient and caregiver. Patient complaining no sensory deficits along major nerve distributions of upper and lower extremities.  Grip strength symmetric bilaterally along with elbow flexion and  extension.  Dorsi/plantarflexion symmetric bilaterally.  Cranial nerves III through XII grossly intact.  Psychiatric:        Mood and Affect: Mood normal.     ED Results / Procedures / Treatments   Labs (all labs ordered are listed, but only abnormal results are displayed) Labs Reviewed  COMPREHENSIVE METABOLIC PANEL - Abnormal; Notable for the following components:      Result Value   CO2 21 (*)    Glucose, Bld 126 (*)    Creatinine, Ser 1.04 (*)    Total Protein 6.4 (*)    All other components within normal limits  CBC WITH DIFFERENTIAL/PLATELET  HIV ANTIBODY (ROUTINE TESTING W REFLEX)    EKG None  Radiology No results found.  Procedures Procedures    Medications Ordered in ED Medications  dexamethasone (DECADRON) injection 4 mg (4 mg Intravenous Given 04/01/22 1401)  levETIRAcetam (KEPPRA) tablet  500 mg (500 mg Oral Given 04/01/22 1446)  ondansetron (ZOFRAN) injection 4 mg (has no administration in time range)  butalbital-acetaminophen-caffeine (FIORICET) 50-325-40 MG per tablet 1 tablet (has no administration in time range)  ipratropium-albuterol (DUONEB) 0.5-2.5 (3) MG/3ML nebulizer solution 3 mL (has no administration in time range)  diazepam (VALIUM) tablet 5 mg (has no administration in time range)  escitalopram (LEXAPRO) tablet 20 mg (has no administration in time range)  traZODone (DESYREL) tablet 150 mg (has no administration in time range)  baclofen (LIORESAL) tablet 10 mg (has no administration in time range)  pantoprazole (PROTONIX) EC tablet 20 mg (has no administration in time range)  bisacodyl (DULCOLAX) EC tablet 5 mg (has no administration in time range)  magnesium citrate solution 1 Bottle (has no administration in time range)    ED Course/ Medical Decision Making/ A&P Clinical Course as of 04/01/22 1504  Fri Apr 01, 2022  1354 Neurosurgeon Dr. Glenford Peers was consulted regarding the patient by Dr. Alvino Chapel.  He suggested MRI of the brain, Decadron 4  mg q6hrs. He suggested admission through hospital medicine. [CR]  [place] of hospital medicine.  He agreed with admission of the patient assuming further treatment/care. [CR]    Clinical Course User Index [CR] Wilnette Kales, PA                           Medical Decision Making Amount and/or Complexity of Data Reviewed Labs: ordered.  Risk Prescription drug management. Decision regarding hospitalization.  This patient presents to the ED for concern of abnormal CT finding, this involves an extensive number of treatment options, and is a complaint that carries with it a high risk of complications and morbidity.  The differential diagnosis includes malignancy, CVA, increased ICP   Co morbidities that complicate the patient evaluation  See HPI   Additional history obtained:  Additional history obtained from EMR External records from outside source obtained and reviewed including CT imaging as indicated in HPI   Lab Tests:  I Ordered, and personally interpreted labs.  The pertinent results include: No leukocytosis noted.  Platelets within normal range.  No evidence of anemia.  Slight decrease in bicarb with a value of 21.  Otherwise electrolytes within normal range.  No evidence of AKI.  No transaminitis noted.   Imaging Studies ordered:  I ordered imaging studies including MRI of brain I independently visualized and interpreted imaging which showed pending upon admission. I agree with the radiologist interpretation   Cardiac Monitoring: / EKG:  The patient was maintained on a cardiac monitor.  I personally viewed and interpreted the cardiac monitored which showed an underlying rhythm of: Sinus rhythm   Consultations Obtained:  See ED course  Problem List / ED Course / Critical interventions / Medication management  I ordered medication including Decadron 4 mg   Reevaluation of the patient after these medicines showed that the patient stayed the  same I have reviewed the patients home medicines and have made adjustments as needed   Social Determinants of Health:  Current tobacco use.  Denies illicit drug use.   Test / Admission - Considered:  Vitals signs within normal range and stable throughout visit. Laboratory/imaging studies significant for: See above Patient's symptoms most likely secondary to lesions concerning for metastatic disease with most likely source given patient history concerning for melanoma.  Patient has CT findings suggestive of significant cerebral edema with left midline shift.  Patient deemed to  meet admission criteria given current clinical status coupled with imaging findings.Appropriate consultations were made as described in the ED course.  Treatment plan were discussed at length with patient and they knowledge understanding was agreeable to said plan.  Patient was stable upon admission to the hospital.        Final Clinical Impression(s) / ED Diagnoses Final diagnoses:  Malignancy [facility])  Cerebral herniation [facility])    Rx / DC Orders ED Discharge Orders     None         [place], [place], [place] 04/01/22 1504    Davonna Belling, MD 04/01/22 1616

## 2022-04-01 NOTE — Progress Notes (Signed)
NIF held. Patient is currently eating a meal at this time. Multiple family members at bedside. No distress or complications noted. At this time.

## 2022-04-01 NOTE — ED Triage Notes (Signed)
Pt BIB [facility] from home due to CT abnormalities that were taken this past Tuesday.  Pt reports headaches for the past 6 weeks.  CT showed lesions on brain and confirmed midline shift.  Pt has ALS and has been worse the last 6 months.  Pt lives with best friend whom is at bedside. Smoker.  VS BP 118/80, 90-100 HR, 98% RA.

## 2022-04-01 NOTE — Progress Notes (Signed)
  Radiation Oncology         [phone]) [phone] ________________________________  Name: Melanie Hogan MRN: [id number]  Date: 04/01/2022  DOB: 04-22-1971  Chart Note:  Dr. Tammi Klippel and I have personally reviewed this patient's most recent findings and wanted to take a minute to document our impression.   This is a 51 y/o female with a history of ALS and Stage IIa melanona resected from her back in 03/2020.  She was not offered any adjuvant systemic therapy due to her immobility associated with ALS as well as heavy smoking and marijuana use.  She has been following with neurology and oncology at [facility] and reportedly had a normal PET last year, but the only PET in Care Everywhere that I can see is from 05/2020. That study showed mild uptake involving the subcutaneous tissues of the medial right back, likely corresponding to the surgical site and a single indeterminate area of uptake within the spleen that is nonspecific and without CT correlate. Mild compression fractures were noted at T6 and T7, age-indeterminate.  More recently, she had a CT Head and CT C/A/P at [facility] on 03/31/22 for evaluation of headache and SOB.  The head CT showed multiple lesions with associated vasogenic edema.  The largest lesion is in the right temporal lobe, measuring 2.4 cm, with edema resulting in significant mass effect and a 5 mm leftward midline shift and medialization of the right uncus.  Findings are most suggestive of metastatic disease.  The CT C/A/P showed numerous pulmonary nodules concerning for metastases as well as irregular left hilar adenopathy and indeterminate hypoenhancing foci within the spleen.  She was transferred to  [facility] and is being worked up for brain MRI and started on Decadron.   Lab Findings: Lab Results  Component Value Date   WBC 5.5 04/01/2022   HGB 12.8 04/01/2022   HCT 37.6 04/01/2022   MCV 91.3 04/01/2022   PLT 233 04/01/2022    '@LASTCHEM'$ @  Radiographic Findings: No results  found.  Impression/Plan:  In light of this information, it appears that she has significant metastatic disease, likely of a lung primary but potentially associated with her history of melanoma.  Hopefully she will have her brain MRI scan soon and we will plan to review this imaging at our upcoming multidisciplinary brain tumor conference on Monday, 04/04/2022.  We recommend continuing on Decadron in the meantime.  Also, ideally, she will be seen by pulmonology for consideration of bronchoscopy for tissue confirmation to expedite her diagnosis and treatment planning.  We will plan for a formal consult with the patient following our discussion at brain tumor board on Monday.    Nicholos Johns, PA-C    Tyler Pita, MD  [facility] Direct Dial: [phone]  Fax: [phone] [email or web address]  Skype  [email or web address]

## 2022-04-02 ENCOUNTER — Encounter (HOSPITAL_COMMUNITY)
Admission: EM | Disposition: A | Discharge status: Home / Self Care | Payer: Self-pay | Source: Home / Self Care | Attending: Internal Medicine

## 2022-04-02 ENCOUNTER — Encounter (HOSPITAL_COMMUNITY): Payer: Self-pay | Admitting: Anesthesiology

## 2022-04-02 DIAGNOSIS — C7931 Secondary malignant neoplasm of brain: Secondary | ICD-10-CM

## 2022-04-02 DIAGNOSIS — Z515 Encounter for palliative care: Secondary | ICD-10-CM | POA: Diagnosis not present

## 2022-04-02 DIAGNOSIS — Z66 Do not resuscitate: Secondary | ICD-10-CM | POA: Diagnosis not present

## 2022-04-02 DIAGNOSIS — M4854XA Collapsed vertebra, not elsewhere classified, thoracic region, initial encounter for fracture: Secondary | ICD-10-CM | POA: Diagnosis not present

## 2022-04-02 DIAGNOSIS — G935 Compression of brain: Secondary | ICD-10-CM

## 2022-04-02 DIAGNOSIS — Z7189 Other specified counseling: Secondary | ICD-10-CM

## 2022-04-02 DIAGNOSIS — F331 Major depressive disorder, recurrent, moderate: Secondary | ICD-10-CM

## 2022-04-02 DIAGNOSIS — G1221 Amyotrophic lateral sclerosis: Secondary | ICD-10-CM | POA: Diagnosis not present

## 2022-04-02 DIAGNOSIS — C4359 Malignant melanoma of other part of trunk: Secondary | ICD-10-CM

## 2022-04-02 DIAGNOSIS — C801 Malignant (primary) neoplasm, unspecified: Secondary | ICD-10-CM

## 2022-04-02 LAB — HIV ANTIBODY (ROUTINE TESTING W REFLEX): HIV Screen 4th Generation wRfx: NONREACTIVE

## 2022-04-02 SURGERY — MRI WITH ANESTHESIA
Anesthesia: General | Laterality: Left

## 2022-04-02 MED ORDER — LORAZEPAM 2 MG/ML IJ SOLN: 1.0000 mg | Freq: Once | INTRAMUSCULAR | Status: DC

## 2022-04-02 MED ORDER — LORAZEPAM 2 MG/ML IJ SOLN
2.0000 mg | Freq: Once | INTRAMUSCULAR | Status: DC
Start: 2022-04-02 — End: 2022-04-02

## 2022-04-02 MED ORDER — BOOST PLUS PO LIQD
237.0000 mL | Freq: Two times a day (BID) | ORAL | Status: DC
  Administered 2022-04-03 – 2022-04-04 (×3): 237 mL via ORAL
  Filled 2022-04-02 (×8): qty 237

## 2022-04-02 MED ORDER — ADULT MULTIVITAMIN W/MINERALS CH
1.0000 | ORAL_TABLET | Freq: Every day | ORAL | Status: DC
  Administered 2022-04-03 – 2022-04-06 (×3): 1 via ORAL
  Filled 2022-04-02 (×3): qty 1

## 2022-04-02 NOTE — Progress Notes (Signed)
[facility] PROGRESS NOTE    Progress Note  Melanie Hogan  JJK:[id number] DOB: 03-Sep-1970 DOA: 04/01/2022 PCP: [name], DO     Brief Narrative:   Melanie Hogan is an 51 y.o. female past medical history significant for ALS, anxiety and depression, melanoma of her back s/p resection with metastases to her brain, chronic thoracic vertebral compression fracture presents with worsening headache and ambulatory dysfunction for which she uses a walker, she has failed ALS treatment due to GI upset.  Recently started on beta-blocker started developing tremors and weakness and she will no longer use a walker.  She also developed headache and a collection of nausea no vision changes or hearing changes.  She went and saw her neurologist, CT of the head on 03/31/2022 was suspicious for multiple intracranial lesions with edema suspicious for metastatic disease along with midline shift.  CT of the chest, abdomen and pelvis done on 04/01/2022 showed multiple pulmonary nodules concerning for metastatic disease and irregular adenopathy.  Neurosurgery was consulted recommended MRI without contrast.    Assessment/Plan:   Melanoma metastatic to brain [facility]) Neurosurgery was consulted recommended MRI of the brain which is pending.  We will have to do the MRI with sedation She was started on IV Decadron and Keppra for seizure prophylaxis. Oncology recommended to consult radiation oncology and outpatient follow-up with melanoma specialist Dr. Alen Blew. Radiation oncology recommended to stabilize and they will start radiation on Monday. Avoid chemical DVT prophylaxis due to her melanoma.  Anxiety/  Moderate episode of recurrent major depressive disorder (HCC) Continue SSRI.  Recurrent melanoma: With significant diffuse metastases.  Collapsed vertebra, not elsewhere classified, thoracic region, initial encounter for fracture ([place]) Noted  ALS (amyotrophic lateral sclerosis) ([place]) - following  w/ neurology  Noted.  DVT prophylaxis: scd Family Communication:none Status is: Inpatient Remains inpatient appropriate because: Metastatic brain cancer    Code Status:     Code Status Orders  (From admission, onward)           Start     Ordered   04/01/22 1442  Full code  Continuous        04/01/22 1446           Code Status History     This patient has a current code status but no historical code status.         IV Access:   Peripheral IV   Procedures and diagnostic studies:   No results found.   Medical Consultants:   None.   Subjective:    [name] relates her speech is better today.  Still sad depressed with labile emotion  Objective:    Vitals:   04/01/22 2102 04/01/22 2200 04/02/22 0016 04/02/22 0429  BP:   (!) 95/59 115/65  Pulse:   (!) 54 (!) 53  Resp: '16 14 16 16  '$ Temp:   98 F (36.7 C) 98.2 F (36.8 C)  TempSrc:   Oral Oral  SpO2:   94% 94%  Weight:      Height:       SpO2: 94 %  No intake or output data in the 24 hours ending 04/02/22 0717 Filed Weights   04/01/22 1127  Weight: 54.4 kg    Exam: General exam: In no acute distress. Respiratory system: Good air movement and clear to auscultation. Cardiovascular system: S1 & S2 heard, RRR. No JVD. Gastrointestinal system: Abdomen is nondistended, soft and nontender.  Extremities: No pedal edema. Skin: No rashes, lesions or ulcers  Psychiatry: Judgement and insight appear normal. Mood & affect appropriate.    Data Reviewed:    Labs: Basic Metabolic Panel: Recent Labs  Lab 04/01/22 1256  NA 141  K 4.2  CL 109  CO2 21*  GLUCOSE 126*  BUN 16  CREATININE 1.04*  CALCIUM 9.9   GFR Estimated Creatinine Clearance: 56.2 mL/min (A) (by C-G formula based on SCr of 1.04 mg/dL (H)). Liver Function Tests: Recent Labs  Lab 04/01/22 1256  AST 19  ALT 15  ALKPHOS 70  BILITOT 0.4  PROT 6.4*  ALBUMIN 3.8   No results for input(s): "LIPASE",  "AMYLASE" in the last 168 hours. No results for input(s): "AMMONIA" in the last 168 hours. Coagulation profile No results for input(s): "INR", "PROTIME" in the last 168 hours. COVID-19 Labs  No results for input(s): "DDIMER", "FERRITIN", "LDH", "CRP" in the last 72 hours.  No results found for: "SARSCOV2NAA"  CBC: Recent Labs  Lab 04/01/22 1256  WBC 5.5  NEUTROABS 4.2  HGB 12.8  HCT 37.6  MCV 91.3  PLT 233   Cardiac Enzymes: No results for input(s): "CKTOTAL", "CKMB", "CKMBINDEX", "TROPONINI" in the last 168 hours. BNP (last 3 results) No results for input(s): "PROBNP" in the last 8760 hours. CBG: No results for input(s): "GLUCAP" in the last 168 hours. D-Dimer: No results for input(s): "DDIMER" in the last 72 hours. Hgb A1c: No results for input(s): "HGBA1C" in the last 72 hours. Lipid Profile: No results for input(s): "CHOL", "HDL", "LDLCALC", "TRIG", "CHOLHDL", "LDLDIRECT" in the last 72 hours. Thyroid function studies: No results for input(s): "TSH", "T4TOTAL", "T3FREE", "THYROIDAB" in the last 72 hours.  Invalid input(s): "FREET3" Anemia work up: No results for input(s): "VITAMINB12", "FOLATE", "FERRITIN", "TIBC", "IRON", "RETICCTPCT" in the last 72 hours. Sepsis Labs: Recent Labs  Lab 04/01/22 1256  WBC 5.5   Microbiology No results found for this or any previous visit (from the past 240 hour(s)).   Medications:    baclofen  10 mg Oral TID   dexamethasone (DECADRON) injection  4 mg Intravenous Q6H   escitalopram  20 mg Oral QHS   feeding supplement  237 mL Oral BID BM   levETIRAcetam  500 mg Oral BID   pantoprazole  20 mg Oral Daily   Continuous Infusions:    LOS: 1 day   Charlynne Cousins  [facility]  04/02/2022, 7:17 AM

## 2022-04-02 NOTE — Evaluation (Signed)
Physical Therapy Evaluation Patient Details Name: ZANYIAH POSTEN MRN: [id number] DOB: 1970-09-07 Today's Date: 04/02/2022  History of Present Illness  51 y.o. female past medical history significant for ALS, anxiety and depression, melanoma of her back s/p resection with metastases to her brain, chronic thoracic vertebral compression fracture. Admitted 8/25 after CT revealing multiple brain lesions.  Clinical Impression  Pt admitted with above diagnosis. Patient with conflicting hx compared to prior notes, currently states she lives with her brother Fritz Pickerel who has started to assist her with transfers to and from w/c approx 3 weeks ago, by "lifting her up." States she just received a Hoyer lift at home. Does not wish to participate with AIR or SNF level of rehab due to her current diagnosis of brain mets, and would like to return home upon d/c. Pt has adequate equipment but will require assistance with mobility and household chores, which she states her brother can provide. Declines attempting to transfer for stand with therapy today. Pt currently with functional limitations due to the deficits listed below (see PT Problem List). Pt will benefit from skilled PT to increase their independence and safety with mobility to allow discharge to the venue listed below.          Recommendations for follow up therapy are one component of a multi-disciplinary discharge planning process, led by the attending physician.  Recommendations may be updated based on patient status, additional functional criteria and insurance authorization.  Follow Up Recommendations Home health PT (If patient willing - can review transfer techniques, equipment use with pt and family.) May benefit from aide.      Assistance Recommended at Discharge Intermittent Supervision/Assistance  Patient can return home with the following  A lot of help with bathing/dressing/bathroom;Assistance with cooking/housework;Direct  supervision/assist for medications management;Direct supervision/assist for financial management;Assist for transportation    Equipment Recommendations None recommended by PT  Recommendations for Other Services       Functional Status Assessment Patient has had a recent decline in their functional status and demonstrates the ability to make significant improvements in function in a reasonable and predictable amount of time.     Precautions / Restrictions Precautions Precautions: Fall;Back (back for comfort) Precaution Comments: Chronic compression fx t-spine Restrictions Weight Bearing Restrictions: No      Mobility  Bed Mobility Overal bed mobility: Needs Assistance Bed Mobility: Rolling, Supine to Sit, Sit to Supine Rolling: Min assist   Supine to sit: Min assist, HOB elevated Sit to supine: Min assist   General bed mobility comments: Min assist to roll left and right several times while assisting to change linenes due to being saturated with urine. Pt able to use railing, needed some assist with LE placement. Min assist for trunk and to scoot to EOB for supine to sit transfer onto EOB, min assist for trunk and LEs back into bed. Able to push well through LEs in bridge position to reposition at [facility].    Transfers Overall transfer level:  (Declines)                      Ambulation/Gait               General Gait Details: Declines  Stairs            Wheelchair Mobility    Modified Rankin (Stroke Patients Only)       Balance Overall balance assessment: Needs assistance Sitting-balance support: Single extremity supported, Feet supported Sitting balance-Leahy Scale: Poor Sitting balance -  Comments: Tol sitting EOB > 5 min with reduced trunk control, holds rail or bed tol stabilize Postural control: Posterior lean                                   Pertinent Vitals/Pain Pain Assessment Pain Assessment: No/denies pain    Home  Living Family/patient expects to be discharged to:: Private residence Living Arrangements: Other relatives (Brother - Personnel officer) Available Help at Discharge: Family;Available 24 hours/day Type of Home: Apartment Home Access: Level entry       Home Layout: One level Home Equipment: Wheelchair - manual;Wheelchair - power;Tub bench;Grab bars - tub/shower;Grab bars - toilet;Other (comment) (Hoyer lift and "adjustable bed")      Prior Function Prior Level of Function : Needs assist;History of Falls (last six months)       Physical Assist : Mobility (physical);ADLs (physical) Mobility (physical): Transfers ADLs (physical): IADLs Mobility Comments: Pt reports brother has had to help her more frequently over the past 3 weeks, including assist with transfers to her w/c. Has been primarily using manual w/c in apartment.       Hand Dominance   Dominant Hand: Right    Extremity/Trunk Assessment   Upper Extremity Assessment Upper Extremity Assessment: Defer to OT evaluation    Lower Extremity Assessment Lower Extremity Assessment: Generalized weakness (Pt declines comprehensive testing. LEs grossly 3-/5 as assessed distally and grossly 2/5 proxmially)       Communication   Communication: Expressive difficulties  Cognition Arousal/Alertness: Awake/alert Behavior During Therapy: WFL for tasks assessed/performed Overall Cognitive Status: Within Functional Limits for tasks assessed                                          General Comments General comments (skin integrity, edema, etc.): History conflicting with previous reports of who she lives with.    Exercises     Assessment/Plan    PT Assessment Patient needs continued PT services  PT Problem List Decreased strength;Decreased activity tolerance;Decreased balance;Decreased mobility;Decreased knowledge of use of DME;Impaired tone       PT Treatment Interventions DME instruction;Gait training;Functional mobility  training;Therapeutic activities;Therapeutic exercise;Balance training;Neuromuscular re-education;Patient/family education;Wheelchair mobility training    PT Goals (Current goals can be found in the Care Plan section)  Acute Rehab PT Goals Patient Stated Goal: Go home, not to rehab PT Goal Formulation: With patient Time For Goal Achievement: 04/09/22 Potential to Achieve Goals: Fair    Frequency Min 3X/week     Co-evaluation               AM-PAC PT "6 Clicks" Mobility  Outcome Measure Help needed turning from your back to your side while in a flat bed without using bedrails?: A Little Help needed moving from lying on your back to sitting on the side of a flat bed without using bedrails?: A Little Help needed moving to and from a bed to a chair (including a wheelchair)?: A Lot Help needed standing up from a chair using your arms (e.g., wheelchair or bedside chair)?: A Lot Help needed to walk in hospital room?: Total Help needed climbing 3-5 steps with a railing? : Total 6 Click Score: 12    End of Session   Activity Tolerance: Other (comment) (Self limiting) Patient left: in bed;with call bell/phone within reach;with bed alarm set (HOB> 30  deg) Nurse Communication:  (NT notified of need for new purewick) PT Visit Diagnosis: Muscle weakness (generalized) (M62.81);Other symptoms and signs involving the nervous system (R29.898);Difficulty in walking, not elsewhere classified (R26.2)    Time: 3582-5189 PT Time Calculation (min) (ACUTE ONLY): 35 min   Charges:   PT Evaluation $PT Eval Moderate Complexity: 1 Mod PT Treatments $Therapeutic Activity: 8-22 mins        Candie Mile, PT, DPT Physical Therapist - [place]   Ellouise Newer 04/02/2022, 9:36 AM

## 2022-04-02 NOTE — Progress Notes (Signed)
Initial Nutrition Assessment  DOCUMENTATION CODES:   Not applicable  INTERVENTION:   D/C Ensure. Change to Boost Plus PO BID, each supplement provides 360 kcal and 14 gm protein.  MVI with minerals daily.  NUTRITION DIAGNOSIS:   Inadequate oral intake related to decreased appetite as evidenced by per patient/family report.  GOAL:   Patient will meet greater than or equal to 90% of their needs  MONITOR:   PO intake, Supplement acceptance  REASON FOR ASSESSMENT:   Malnutrition Screening Tool    ASSESSMENT:   51 yo female admitted with worsening headache and ambulatory dysfunction. PMH includes ALS, anxiety, depression, melanoma S/P resection, chronic vertebral compression fracture.  Patient with new brain metastases, recurrent melanoma. Plans to begin radiation therapy Monday. CT chest/abd/pelvis showed hilar lymphadenopathy and numerous pulmonary lesions. Neurosurgery recommends MRI brain with and without contrast. May receive palliative radiotherapy, but palliative resection is thought not to be beneficial. Spoke with patient over the phone. She reports intake has been okay. She does not really like Ensure because it is too thick, but she says she has some chocolate Ensure supplements at home. She only wants chocolate flavor and currently hospital pharmacy is out of chocolate Ensure. Will change to chocolate Boost Plus. She has lost 11% of her usual weight within the past 7 months. This could be related to muscle degeneration associated with ALS as well as suspected recent poor intake. Ensure supplements were ordered BID, but patient refused this morning. Currently NPO for potential procedure; previously on a regular diet.    Labs reviewed.  Medications reviewed and include Decadron, Protonix.  NUTRITION - FOCUSED PHYSICAL EXAM:  Unable to complete, RD working remotely  Diet Order:   Diet Order             Diet NPO time specified  Diet effective now                    EDUCATION NEEDS:   No education needs have been identified at this time  Skin:  Skin Assessment: Reviewed RN Assessment  Last BM:  8/23  Height:   Ht Readings from Last 1 Encounters:  04/01/22 '5\' 6"'$  (1.676 m)    Weight:   Wt Readings from Last 1 Encounters:  04/01/22 54.4 kg     BMI:  Body mass index is 19.37 kg/m.  Estimated Nutritional Needs:   Kcal:  1650-1850  Protein:  70-85 gm  Fluid:  1.7-1.9 L   Lucas Mallow RD, LDN, CNSC Please refer to Amion for contact information.

## 2022-04-02 NOTE — Consult Note (Signed)
Palliative Care Consult Note                                  Date: 04/02/2022   Patient Name: MEKIYAH GLADWELL  DOB: April 05, 1971  MRN: [id number]  Age / Sex: 51 y.o., female  PCP: [name], DO Referring Physician: Charlynne Cousins, MD  Reason for Consultation: {Reason for Consult:23484}  HPI/Patient Profile: 51 y.o. female  with past medical history of *** admitted on 04/01/2022 with ***.   Past Medical History:  Diagnosis Date   ALS (amyotrophic lateral sclerosis) (HCC)    Anxiety    At high risk for falls    Depression    Migraines    Tobacco use disorder     Subjective:   This NP Walden Field reviewed medical records, received report from team, assessed the patient and then meet at the patient's bedside to discuss diagnosis, prognosis, GOC, EOL wishes disposition and options.  I met with ***.   Concept of Palliative Care was introduced as specialized medical care for people and their families living with serious illness.  If focuses on providing relief from the symptoms and stress of a serious illness.  The goal is to improve quality of life for both the patient and the family. Values and goals of care important to patient and family were attempted to be elicited.  Created space and opportunity for patient  and family to explore thoughts and feelings regarding current medical situation   Natural trajectory and current clinical status were discussed. Questions and concerns addressed. Patient  encouraged to call with questions or concerns.    Patient/Family Understanding of Illness: ***  Life Review: ***  Patient Values: ***  Goals: ***  Today's Discussion: ***  Review of Systems  Objective:   Primary Diagnoses: Present on Admission:  Moderate episode of recurrent major depressive disorder (HCC)  Malignant melanoma of torso excluding breast (HCC)  ALS (amyotrophic lateral sclerosis) ([place]) - following  w/ neurology   Collapsed vertebra, not elsewhere classified, thoracic region, initial encounter for fracture [facility])   Physical Exam  Vital Signs:  BP 108/65   Pulse 68   Temp (!) 97.5 F (36.4 C) (Oral)   Resp 18   Ht 5' 6"  (1.676 m)   Wt 54.4 kg   SpO2 94%   BMI 19.37 kg/m   Palliative Assessment/Data: ***    Advanced Care Planning:   Primary Decision Maker: {Primary Decision YTWKM:62863}  Code Status/Advance Care Planning: {Palliative Code status:23503}  A discussion was had today regarding advanced directives. Concepts specific to code status, artifical feeding and hydration, continued IV antibiotics and rehospitalization was had.  The difference between a aggressive medical intervention path and a palliative comfort care path for this patient at this time was had. ***The MOST form was introduced and discussed.***  Decisions/Changes to ACP: ***  Assessment & Plan:   Impression: ***  SUMMARY OF RECOMMENDATIONS   ***  Symptom Management:  ***  Prognosis:  {Palliative Care Prognosis:23504}  Discharge Planning:  {Palliative dispostion:23505}   Discussed with: ***    Thank you for allowing [place] to participate in the care of LEILYN FRAYRE PMT will continue to support holistically.  Time Total: ***  Greater than 50%  of this time was spent counseling and coordinating care related to the above assessment and plan.  Signed by: Walden Field, NP Palliative Medicine Team  Team Phone #  [phone] (Nights/Weekends)  04/02/2022, 11:24 AM

## 2022-04-02 NOTE — Evaluation (Signed)
Occupational Therapy Evaluation Patient Details Name: Melanie Hogan MRN: [id number] DOB: April 18, 1971 Today's Date: 04/02/2022   History of Present Illness 51 y.o. female past medical history significant for ALS, anxiety and depression, melanoma of her back s/p resection with metastases to her brain, chronic thoracic vertebral compression fracture. Admitted 8/25 after CT revealing multiple brain lesions.   Clinical Impression   Pt needs assist at baseline for ADLs and mobility, lives with her brother who has been providing assist for transfers and ADLs. However, per family, pt will have 24/7 assist at home as family plans to take turns staying with pt. Pt currently declining bed mobility/OOB mobility this session due to just working with PT. Pt needs min-max A for ADL, decreased strength and coordination in BUE, needing minA to thoroughly brush teeth at bed level. Pt presenting with impairments listed below, will follow acutely. Recommend HHOT at d/c.     Recommendations for follow up therapy are one component of a multi-disciplinary discharge planning process, led by the attending physician.  Recommendations may be updated based on patient status, additional functional criteria and insurance authorization.   Follow Up Recommendations  Home health OT    Assistance Recommended at Discharge Frequent or constant Supervision/Assistance  Patient can return home with the following A lot of help with walking and/or transfers;A lot of help with bathing/dressing/bathroom;Assistance with cooking/housework;Assistance with feeding;Direct supervision/assist for medications management;Direct supervision/assist for financial management;Assist for transportation;Help with stairs or ramp for entrance    Functional Status Assessment  Patient has had a recent decline in their functional status and demonstrates the ability to make significant improvements in function in a reasonable and predictable amount of  time.  Equipment Recommendations  None recommended by OT (pt has all needed DME)    Recommendations for Other Services PT consult     Precautions / Restrictions Precautions Precautions: Fall;Back (back for comfort) Restrictions Weight Bearing Restrictions: No      Mobility Bed Mobility               General bed mobility comments: pt declining    Transfers                   General transfer comment: pt declining      Balance                                           ADL either performed or assessed with clinical judgement   ADL Overall ADL's : Needs assistance/impaired Eating/Feeding: Minimal assistance   Grooming: Minimal assistance;Oral care   Upper Body Bathing: Moderate assistance;Maximal assistance   Lower Body Bathing: Maximal assistance   Upper Body Dressing : Moderate assistance;Maximal assistance   Lower Body Dressing: Maximal assistance   Toilet Transfer: Maximal assistance   Toileting- Clothing Manipulation and Hygiene: Maximal assistance               Vision Baseline Vision/History: 1 Wears glasses Vision Assessment?: No apparent visual deficits     Perception     Praxis      Pertinent Vitals/Pain Pain Assessment Pain Assessment: No/denies pain     Hand Dominance Right   Extremity/Trunk Assessment Upper Extremity Assessment Upper Extremity Assessment: Generalized weakness   Lower Extremity Assessment Lower Extremity Assessment: Defer to PT evaluation   Cervical / Trunk Assessment Cervical / Trunk Assessment: Normal   Communication Communication Communication: Expressive difficulties (  slow speech)   Cognition Arousal/Alertness: Awake/alert Behavior During Therapy: WFL for tasks assessed/performed Overall Cognitive Status: Within Functional Limits for tasks assessed                                       General Comments  VSS on RA, family present in room at time of  evaluation    Exercises     Shoulder Instructions      Home Living Family/patient expects to be discharged to:: Private residence Living Arrangements: Other relatives Available Help at Discharge: Family;Available 24 hours/day Type of Home: Apartment Home Access: Level entry     Home Layout: One level     Bathroom Shower/Tub: Tub/shower unit;Curtain     Bathroom Accessibility: Yes   Home Equipment: Wheelchair - manual;Wheelchair - power;Tub bench;Grab bars - tub/shower;Grab bars - toilet;Other (comment) (hoyer lift)          Prior Functioning/Environment Prior Level of Function : Needs assist;History of Falls (last six months)       Physical Assist : Mobility (physical);ADLs (physical) Mobility (physical): Transfers   Mobility Comments: Pt reports brother has had to help her more frequently over the past 3 weeks, including assist with transfers to her w/c. Has been primarily using manual w/c in apartment. ADLs Comments: assists with ADLs including bathing and showering        OT Problem List: Decreased strength;Decreased range of motion;Decreased activity tolerance;Impaired balance (sitting and/or standing);Decreased coordination;Cardiopulmonary status limiting activity      OT Treatment/Interventions: Patient/family education;Energy conservation;DME and/or AE instruction;Therapeutic activities;Self-care/ADL training;Therapeutic exercise;Balance training;Cognitive remediation/compensation;Visual/perceptual remediation/compensation    OT Goals(Current goals can be found in the care plan section) Acute Rehab OT Goals Patient Stated Goal: none stated OT Goal Formulation: With patient Time For Goal Achievement: 04/16/22 Potential to Achieve Goals: Fair ADL Goals Pt Will Perform Eating: with set-up;with adaptive utensils;sitting;bed level Pt Will Perform Grooming: with supervision;bed level;sitting;with adaptive equipment Pt Will Transfer to Toilet: with mod  assist;with transfer board;squat pivot transfer;stand pivot transfer;bedside commode Additional ADL Goal #1: pt will complete bed mobility mod A in prep for ADLs/transfers  OT Frequency: Min 2X/week    Co-evaluation              AM-PAC OT "6 Clicks" Daily Activity     Outcome Measure Help from another person eating meals?: A Little Help from another person taking care of personal grooming?: A Little Help from another person toileting, which includes using toliet, bedpan, or urinal?: A Lot Help from another person bathing (including washing, rinsing, drying)?: A Lot Help from another person to put on and taking off regular upper body clothing?: A Lot Help from another person to put on and taking off regular lower body clothing?: A Lot 6 Click Score: 14   End of Session Nurse Communication: Mobility status  Activity Tolerance: Patient tolerated treatment well Patient left: in bed;with call bell/phone within reach;with bed alarm set;with family/visitor present  OT Visit Diagnosis: Other symptoms and signs involving the nervous system (R29.898);Feeding difficulties (R63.3);Muscle weakness (generalized) (M62.81)                Time: 0263-7858 OT Time Calculation (min): 20 min Charges:  OT General Charges $OT Visit: 1 Visit OT Evaluation $OT Eval Low Complexity: 1 Low  Lynnda Child, OTD, OTR/L Acute Rehab [phone]) [phone] - [place] 04/02/2022, 12:20 PM

## 2022-04-02 NOTE — Progress Notes (Signed)
NIF  neg  20 with good effort.

## 2022-04-02 NOTE — Progress Notes (Signed)
Received a phone call from Dr. Tammi Klippel.  He stated that the MRI brain was ordered STAT, I explained that patient was unable to tolerate with medications/repositioning.  Dr. Lesle Reek stressed that study was needed urgently.   I called anesthesia to coordinate availabilty. Spoke with Millie in Anesthesia, tentatively scheduled for 2 PM.   Educated patient and family. NPO order placed.

## 2022-04-02 NOTE — Consult Note (Signed)
CC: multiple brain lesions  HPI:     Patient is a 51 y.o. female with ALS, hx of stage 2a melanoma s/p resection who presented for progressive weakness, functional decline, and headache.  Her neurologist at [facility] ordered a CT head which showed numerous ring-like brain masses with vasogenic edema.  She was sent to the ER, but left [facility] hospital due to long wait and came here.  CT chest/abd/pelvis showed hilar lymphadenopathy and numerous pulmonary lesions.      Patient Active Problem List   Diagnosis Date Noted   Melanoma metastatic to brain [facility]) 04/01/2022   Impaired ambulation 04/01/2022   ALS (amyotrophic lateral sclerosis) ([place]) - following w/ neurology  03/31/2021   Collapsed vertebra, not elsewhere classified, thoracic region, initial encounter for fracture ([place]) 11/24/2020   Spinal headache 06/24/2020   Malignant melanoma of torso excluding breast ([place]) 02/14/2020   Left leg weakness 12/11/2019   Benign cyst of right breast 10/12/2018   GAD (generalized anxiety disorder) 09/05/2018   Moderate episode of recurrent major depressive disorder ([place]) 09/05/2018   Tobacco use disorder 09/05/2018   Migraine without aura and without status migrainosus, not intractable 09/05/2018   Renal insufficiency 09/05/2018   S/p nephrectomy 09/28/2016   Donor of kidney for transplant 08/29/2016   Past Medical History:  Diagnosis Date   ALS (amyotrophic lateral sclerosis) ([place])    Anxiety    At high risk for falls    Depression    Migraines    Tobacco use disorder     Past Surgical History:  Procedure Laterality Date   ABDOMINAL HYSTERECTOMY     LAPAROSCOPIC NEPHRECTOMY Left 08/2016   donor   TUBAL LIGATION      Medications Prior to Admission  Medication Sig Dispense Refill Last Dose   baclofen (LIORESAL) 10 MG tablet Take 10 mg by mouth 3 (three) times daily.   03/31/2022   diazepam (VALIUM) 5 MG tablet Take 5 mg by mouth 3 (three) times daily as needed for anxiety.    03/31/2022   escitalopram (LEXAPRO) 20 MG tablet Take 1 tablet (20 mg total) by mouth at bedtime. 90 tablet 3 03/31/2022   traZODone (DESYREL) 150 MG tablet Take 1 tablet (150 mg total) by mouth at bedtime as needed. for sleep 90 tablet 3 03/31/2022   Allergies  Allergen Reactions   Morphine Other (See Comments), Itching and Nausea And Vomiting    Per pt was also violent  N/V, ineffective   Riluzole Nausea Only    Social History   Tobacco Use   Smoking status: Every Day    Packs/day: 0.50    Years: 20.00    Total pack years: 10.00    Types: Cigarettes   Smokeless tobacco: Never  Substance Use Topics   Alcohol use: Not Currently    Comment: 1-2 / year    Family History  Problem Relation Age of Onset   Healthy Mother    Cancer Father      Review of Systems Pertinent items are noted in HPI. Pertinent items noted in HPI and remainder of comprehensive ROS otherwise negative.  Objective:   Patient Vitals for the past 8 hrs:  BP Temp Temp src Pulse Resp SpO2  04/02/22 0755 108/65 (!) 97.5 F (36.4 C) Oral 68 18 94 %  04/02/22 0429 115/65 98.2 F (36.8 C) Oral (!) 53 16 94 %   No intake/output data recorded. No intake/output data recorded.      General : Alert, cooperative, appears  gaunt, deconditioned   Head:  Normocephalic/atraumatic    Eyes: PERRL, conjunctiva/corneas clear, EOM's intact. Fundi could not be visualized Neck: Supple Chest:  Respirations unlabored Chest wall: no tenderness or deformity Heart: Regular rate and rhythm Abdomen: Soft, nontender and nondistended Extremities: warm and well-perfused Skin: normal turgor, color and texture Neurologic:  Alert, oriented x 3.  Eyes open spontaneously. PERRL, EOMI, VFC, no facial droop. V1-3 intact. Bilateral pronator drift. Speech dysarthric. 4-/5 strength throughout.       Data Review  CT head without contrast reviewed. Numerous spherical lesions with adjacent edema noted in brain, most significantly in  right temporal lobe.  MRI from 12/2019 showed no structural lesions.   Assessment:   Principal Problem:   Melanoma metastatic to brain [facility]) Active Problems:   Moderate episode of recurrent major depressive disorder (HCC)   Malignant melanoma of torso excluding breast (HCC)   Collapsed vertebra, not elsewhere classified, thoracic region, initial encounter for fracture ([place])   ALS (amyotrophic lateral sclerosis) ([place]) - following w/ neurology   This is a 51 yo F with ALS, hx of melanoma resection who likely has diffuse metastatic disease.  It is unclear if this represents metastatic melanoma or a new pathology.  She has poor baseline performance status from her ALS.  Her clinical symptoms have improved since starting dexamethasone.  Plan:  - I had a long discussion with the patient's family.  I am recommending an MRI brain with and without contrast including BrainLab protocol for further characterization.  However, given the multiplicity of intracranial lesions, her systemic disease, and her poor baseline performance status, it is unlikely she would benefit from palliative resection, specifically of her right temporal lobe lesions.  The family states they would potentially be interested in palliative radiotherapy.  In the meantime, she can continue with the dexamethasone.  Her case will be discussed at interdisciplinary tumor board on 8/28.

## 2022-04-02 NOTE — Progress Notes (Signed)
RT  Note- NIF-20,  best effort

## 2022-04-02 NOTE — Progress Notes (Signed)
Entered brain MRI order as routine in hopes of obtaining Monday with general anesthesia, pending progress over the next 24-48 hours and evolving goals of care.

## 2022-04-02 NOTE — Progress Notes (Signed)
     Referral received for Melanie Hogan for goals of care discussion. Chart reviewed and updates received from RN. Patient assessed and [place] discussion had with family present. See recommendations below. Full note to follow.   SUMMARY OF RECOMMENDATIONS   Change to DNR No feeding tube, no trach GOC document completed in ACP tab/Vynca Referral to Alda Lea, NP at the palliative medicine/symptom management clinic in the [place] chaplain for patient/family support Attempt ACP documents (living will, HCPOA) with chaplain support PMT will continue to follow   Thank you for your referral and allowing PMT to assist in [place] care.   Walden Field, NP Palliative Medicine Team Phone: [phone]  NO CHARGE

## 2022-04-02 NOTE — Progress Notes (Signed)
This patient was admitted with suspected multiple brain, metastasis and significant cerebral edema layered on a past medical history of ALS. Stat brain MRI was indicated based on head CT findings at [facility]. The stat MRI of the brain was attempted last night, but could not be performed without anesthesia. This afternoon, I replaced the stat brain MRI order to include general anesthesia. However, in reviewing patient records and prog ress over the weekend, I have canceled the brain MRI order. Under the clinical circumstances, I suspect continued dexamethasone and monitoring, as well as discussion with palliative care regarding potential switch to comfort care would be appropriate. I will continue to follow her progress.

## 2022-04-03 DIAGNOSIS — G935 Compression of brain: Secondary | ICD-10-CM | POA: Diagnosis not present

## 2022-04-03 DIAGNOSIS — Z66 Do not resuscitate: Secondary | ICD-10-CM | POA: Diagnosis not present

## 2022-04-03 DIAGNOSIS — Z515 Encounter for palliative care: Secondary | ICD-10-CM | POA: Diagnosis not present

## 2022-04-03 DIAGNOSIS — M4854XA Collapsed vertebra, not elsewhere classified, thoracic region, initial encounter for fracture: Secondary | ICD-10-CM | POA: Diagnosis not present

## 2022-04-03 DIAGNOSIS — Z7189 Other specified counseling: Secondary | ICD-10-CM | POA: Diagnosis not present

## 2022-04-03 DIAGNOSIS — C7931 Secondary malignant neoplasm of brain: Secondary | ICD-10-CM | POA: Diagnosis not present

## 2022-04-03 DIAGNOSIS — G1221 Amyotrophic lateral sclerosis: Secondary | ICD-10-CM | POA: Diagnosis not present

## 2022-04-03 NOTE — Progress Notes (Signed)
NIF -22 VC 1.2 Good effort

## 2022-04-03 NOTE — Progress Notes (Signed)
Daily Progress Note   Patient Name: Melanie Hogan       Date: 04/03/2022 DOB: [birth year]  Age: 51 y.o. MRN#: [id number] Attending Physician: Charlynne Cousins, MD Primary Care Physician: [name], DO Admit Date: 04/01/2022 Length of Stay: 2 days  Reason for Consultation/Follow-up: Establishing goals of care  HPI/Patient Profile:  51 y.o. female  with past medical history of ALS, anxiety/depression, melanoma on back status post resection, chronic thoracic vertebral compression fracture, presented with worsening of headaches and ambulation dysfunction. She recently shortness of breath along with progressive tremors and weakness.  Subsequently she also developed a global headache with occasional nausea.  CT of the head was ordered by neurology that showed multiple intracranial lesions and edema suspicious for metastatic lesions along with midline shift and then follow-up CT of the abdomen showed multiple metastatic lesions in the lungs as well as hilar nodes.  She was admitted on 04/01/2022 with metastatic brain lesions with mass effect and midline shift, recurrent melanoma with mets to brain and lung.   PMT was consulted for goals of care conversations.  Subjective:   Subjective: Chart Reviewed. Updates received. Patient Assessed. Created space and opportunity for patient  and family to explore thoughts and feelings regarding current medical situation.  Today's Discussion: Met with the patient and her brother Fritz Pickerel who is at the bedside. She is eating fresh fruit and cookies. She seems in good spirits. We discussed current plan for MRI (likely Tuesday due to need for anesthesia/sedation) and then likely transfer to [facility] after that for palliative radiation of brain lesions.  She states she feels ok today, some swallowing difficulty remains. Some crying today with her brother which we reaffirmed it is ok to cry. She tries to counts every victory ("I cheer for myself when I can  swallow a pill or do something that is challenging"). I encouraged her to continue to celebrate all these successes.  We discussed that she wants her brother Fritz Pickerel to be the primary decision maker, chaplain has been consulted to assist with this but would likely Monday at the earliest.  We had more discussion about [place] football and she's excited to watch their first game (9/2 against [employer]). She continues to find great joy in this.  I explained that a colleague will continue to follow along to support her as best as we can. They both expressed appreciation.  I provided emotional and general support through therapeutic listening, empathy, sharing of stories, and other techniques. I answered all questions and addressed all concerns to the best of my ability.  Review of Systems  Constitutional:        Denies pain in general  Gastrointestinal:  Negative for abdominal pain, nausea and vomiting.       Some difficulty with swallowing, better if she sits upright; denies choking     Objective:   Vital Signs:  BP 113/61 (BP Location: Left Arm)   Pulse (!) 47   Temp (!) 97.4 F (36.3 C) (Oral)   Resp 15   Ht _0  (1.676 m)   Wt 54.4 kg   SpO2 93%   BMI 19.37 kg/m   Physical Exam: Physical Exam  Palliative Assessment/Data: 40-50%   Assessment & Plan:   Impression: Present on Admission:  Moderate episode of recurrent major depressive disorder (HCC)  Malignant melanoma of torso excluding breast (HCC)  ALS (amyotrophic lateral sclerosis) ([place]) - following w/ neurology   Collapsed vertebra, not elsewhere classified, thoracic region, initial encounter  for fracture [facility])  51 year old female with baseline ALS she admits with newly diagnosed metastatic melanoma with lesions in the lungs, hilar lymphadenopathy, lesions in the brain.  Brain lesions are particularly bothersome with noted midline shift.  No surgical options.  Plans for MRI of the brain and consideration of palliative  radiation, outpatient follow-up with oncology.  Symptoms likely related to her brain mets include global headache (improved today), early dysphagia symptoms.  Overall prognosis is poor long-term.  SUMMARY OF RECOMMENDATIONS   Continue DNR No feeding tube, no trach GOC document completed in ACP tab/Vynca Referred to Alda Lea, NP at the palliative medicine/symptom management clinic in the [facility] chaplain for patient/family support Attempt ACP documents (living will, HCPOA) with chaplain support PMT will continue to follow  Symptom Management:  Per primary team PMT is available to assist as needed  Code Status: DNR  Prognosis: Unable to determine  Discharge Planning: To Be Determined  Discussed with: Patient, family, medical team, nursing team  Thank you for allowing [place] to participate in the care of ANETTE BARRA PMT will continue to support holistically.  Time Total: 60 min  Visit consisted of counseling and education dealing with the complex and emotionally intense issues of symptom management and palliative care in the setting of serious and potentially life-threatening illness. Greater than 50%  of this time was spent counseling and coordinating care related to the above assessment and plan.  Walden Field, NP Palliative Medicine Team  Team Phone # [phone] (Nights/Weekends)  04/06/2021, 8:17 AM

## 2022-04-03 NOTE — Progress Notes (Signed)
[facility] PROGRESS NOTE    Progress Note  Melanie Hogan  KMQ:[id number] DOB: Dec 17, 1970 DOA: 04/01/2022 PCP: Melanie Nutting, DO     Brief Narrative:   Melanie Hogan is an 51 y.o. female past medical history significant for ALS, anxiety and depression, melanoma of her back s/p resection with metastases to her brain, chronic thoracic vertebral compression fracture presents with worsening headache and ambulatory dysfunction for which she uses a walker, she has failed ALS treatment due to GI upset.  Recently started on beta-blocker started developing tremors and weakness and she will no longer use a walker.  She also developed headache and a collection of nausea no vision changes or hearing changes.  She went and saw her neurologist, CT of the head on 03/31/2022 was suspicious for multiple intracranial lesions with edema suspicious for metastatic disease along with midline shift.  CT of the chest, abdomen and pelvis done on 04/01/2022 showed multiple pulmonary nodules concerning for metastatic disease and irregular adenopathy.  Neurosurgery was consulted recommended MRI without contrast.    Assessment/Plan:   Melanoma metastatic to brain [facility]) Neurosurgery related she is not a candidate for palliative resection. MRI of the brain with sedation hopefully on Tuesday. Continue IV Decadron and Keppra for seizure prophylaxis. Oncology recommended outpatient follow-up with melanoma specialist Melanie Hogan. Radiation oncology on board and awaiting MRI for further recommendations. Avoid chemical DVT prophylaxis due to her melanoma. Patient met with palliative care and she is now DNR no feeding tube or tracheostomy.  Anxiety/  Moderate episode of recurrent major depressive disorder (HCC) Continue SSRI.  Recurrent melanoma: With significant diffuse metastases.  Collapsed vertebra, not elsewhere classified, thoracic region, initial encounter for fracture ([place]) Noted  ALS  (amyotrophic lateral sclerosis) ([place]) - following w/ neurology  Noted.  DVT prophylaxis: scd Family Communication:none Status is: Inpatient Remains inpatient appropriate because: Metastatic brain cancer    Code Status:     Code Status Orders  (From admission, onward)           Start     Ordered   04/01/22 1442  Full code  Continuous        04/01/22 1446           Code Status History     This patient has a current code status but no historical code status.         IV Access:   Peripheral IV   Procedures and diagnostic studies:   No results found.   Medical Consultants:   None.   Subjective:    Melanie Hogan complaints relates her symptoms are better.  Objective:    Vitals:   04/02/22 1605 04/02/22 1952 04/03/22 0527 04/03/22 0807  BP: 109/65 108/67 110/67 113/61  Pulse: (!) 56 (!) 59 62 (!) 47  Resp: 19 18 20 15   Temp: 97.7 F (36.5 C) 98.6 F (37 C) 98.5 F (36.9 C) (!) 97.4 F (36.3 C)  TempSrc: Oral Oral Oral Oral  SpO2: 95% 94% 95% 93%  Weight:      Height:       SpO2: 93 %   Intake/Output Summary (Last 24 hours) at 04/03/2022 0914 Last data filed at 04/03/2022 0500 Gross per 24 hour  Intake --  Output 500 ml  Net -500 ml   Filed Weights   04/01/22 1127  Weight: 54.4 kg    Exam: General exam: In no acute distress. Respiratory system: Good air movement and clear to auscultation. Cardiovascular system: S1 & S2  heard, RRR. No JVD. Gastrointestinal system: Abdomen is nondistended, soft and nontender.  Extremities: No pedal edema. Skin: No rashes, lesions or ulcers Psychiatry: Judgement and insight appear normal. Mood & affect appropriate.   Data Reviewed:    Labs: Basic Metabolic Panel: Recent Labs  Lab 04/01/22 1256  NA 141  K 4.2  CL 109  CO2 21*  GLUCOSE 126*  BUN 16  CREATININE 1.04*  CALCIUM 9.9    GFR Estimated Creatinine Clearance: 56.2 mL/min (A) (by C-G formula based on SCr of 1.04  mg/dL (H)). Liver Function Tests: Recent Labs  Lab 04/01/22 1256  AST 19  ALT 15  ALKPHOS 70  BILITOT 0.4  PROT 6.4*  ALBUMIN 3.8    No results for input(s): "LIPASE", "AMYLASE" in the last 168 hours. No results for input(s): "AMMONIA" in the last 168 hours. Coagulation profile No results for input(s): "INR", "PROTIME" in the last 168 hours. COVID-19 Labs  No results for input(s): "DDIMER", "FERRITIN", "LDH", "CRP" in the last 72 hours.  No results found for: "SARSCOV2NAA"  CBC: Recent Labs  Lab 04/01/22 1256  WBC 5.5  NEUTROABS 4.2  HGB 12.8  HCT 37.6  MCV 91.3  PLT 233    Cardiac Enzymes: No results for input(s): "CKTOTAL", "CKMB", "CKMBINDEX", "TROPONINI" in the last 168 hours. BNP (last 3 results) No results for input(s): "PROBNP" in the last 8760 hours. CBG: No results for input(s): "GLUCAP" in the last 168 hours. D-Dimer: No results for input(s): "DDIMER" in the last 72 hours. Hgb A1c: No results for input(s): "HGBA1C" in the last 72 hours. Lipid Profile: No results for input(s): "CHOL", "HDL", "LDLCALC", "TRIG", "CHOLHDL", "LDLDIRECT" in the last 72 hours. Thyroid function studies: No results for input(s): "TSH", "T4TOTAL", "T3FREE", "THYROIDAB" in the last 72 hours.  Invalid input(s): "FREET3" Anemia work up: No results for input(s): "VITAMINB12", "FOLATE", "FERRITIN", "TIBC", "IRON", "RETICCTPCT" in the last 72 hours. Sepsis Labs: Recent Labs  Lab 04/01/22 1256  WBC 5.5    Microbiology No results found for this or any previous visit (from the past 240 hour(s)).   Medications:    baclofen  10 mg Oral TID   dexamethasone (DECADRON) injection  4 mg Intravenous Q6H   escitalopram  20 mg Oral QHS   lactose free nutrition  237 mL Oral BID BM   levETIRAcetam  500 mg Oral BID   LORazepam  1 mg Intravenous Once   multivitamin with minerals  1 tablet Oral Daily   pantoprazole  20 mg Oral Daily   Continuous Infusions:    LOS: 2 days    Melanie Hogan  [facility]  04/03/2022, 9:14 AM

## 2022-04-03 NOTE — Progress Notes (Signed)
NIF -15cmH20 VC 1.5L

## 2022-04-04 ENCOUNTER — Inpatient Hospital Stay: Payer: Medicare HMO | Attending: Radiation Oncology

## 2022-04-04 ENCOUNTER — Inpatient Hospital Stay (HOSPITAL_COMMUNITY): Payer: Medicare HMO | Admitting: Anesthesiology

## 2022-04-04 ENCOUNTER — Encounter (HOSPITAL_COMMUNITY)
Admission: EM | Disposition: A | Discharge status: Home / Self Care | Payer: Self-pay | Source: Home / Self Care | Attending: Internal Medicine

## 2022-04-04 ENCOUNTER — Encounter: Payer: Self-pay | Admitting: Radiation Oncology

## 2022-04-04 ENCOUNTER — Encounter (HOSPITAL_COMMUNITY): Payer: Self-pay | Admitting: Internal Medicine

## 2022-04-04 ENCOUNTER — Inpatient Hospital Stay (HOSPITAL_COMMUNITY): Payer: Medicare HMO

## 2022-04-04 ENCOUNTER — Ambulatory Visit
Admit: 2022-04-04 | Discharge: 2022-04-04 | Disposition: A | Discharge status: Home / Self Care | Payer: Medicare HMO | Source: Ambulatory Visit | Attending: Radiation Oncology | Admitting: Radiation Oncology

## 2022-04-04 DIAGNOSIS — C7931 Secondary malignant neoplasm of brain: Secondary | ICD-10-CM

## 2022-04-04 DIAGNOSIS — F418 Other specified anxiety disorders: Secondary | ICD-10-CM

## 2022-04-04 DIAGNOSIS — M4854XA Collapsed vertebra, not elsewhere classified, thoracic region, initial encounter for fracture: Secondary | ICD-10-CM | POA: Diagnosis not present

## 2022-04-04 DIAGNOSIS — N289 Disorder of kidney and ureter, unspecified: Secondary | ICD-10-CM

## 2022-04-04 DIAGNOSIS — C4490 Unspecified malignant neoplasm of skin, unspecified: Secondary | ICD-10-CM

## 2022-04-04 DIAGNOSIS — G1221 Amyotrophic lateral sclerosis: Secondary | ICD-10-CM | POA: Diagnosis not present

## 2022-04-04 DIAGNOSIS — G935 Compression of brain: Secondary | ICD-10-CM | POA: Diagnosis not present

## 2022-04-04 HISTORY — PX: RADIOLOGY WITH ANESTHESIA: SHX6223

## 2022-04-04 SURGERY — MRI WITH ANESTHESIA
Anesthesia: General

## 2022-04-04 MED ORDER — FENTANYL CITRATE (PF) 100 MCG/2ML IJ SOLN
INTRAMUSCULAR | Status: DC | PRN
  Administered 2022-04-04: 50 ug via INTRAVENOUS

## 2022-04-04 MED ORDER — LACTATED RINGERS IV SOLN: INTRAVENOUS | Status: DC | PRN

## 2022-04-04 MED ORDER — ORAL CARE MOUTH RINSE: 15.0000 mL | Freq: Once | OROMUCOSAL | Status: AC

## 2022-04-04 MED ORDER — SUGAMMADEX SODIUM 200 MG/2ML IV SOLN
INTRAVENOUS | Status: DC | PRN
  Administered 2022-04-04: 150 mg via INTRAVENOUS

## 2022-04-04 MED ORDER — CHLORHEXIDINE GLUCONATE 0.12 % MT SOLN
15.0000 mL | Freq: Once | OROMUCOSAL | Status: AC
Start: 2022-04-04 — End: 2022-04-04

## 2022-04-04 MED ORDER — ONDANSETRON HCL 4 MG/2ML IJ SOLN
INTRAMUSCULAR | Status: DC | PRN
  Administered 2022-04-04: 4 mg via INTRAVENOUS

## 2022-04-04 MED ORDER — DEXAMETHASONE SODIUM PHOSPHATE 10 MG/ML IJ SOLN
INTRAMUSCULAR | Status: DC | PRN
  Administered 2022-04-04: 10 mg via INTRAVENOUS

## 2022-04-04 MED ORDER — PROPOFOL 10 MG/ML IV BOLUS
INTRAVENOUS | Status: DC | PRN
  Administered 2022-04-04: 110 mg via INTRAVENOUS

## 2022-04-04 MED ORDER — PHENYLEPHRINE 80 MCG/ML (10ML) SYRINGE FOR IV PUSH (FOR BLOOD PRESSURE SUPPORT)
PREFILLED_SYRINGE | INTRAVENOUS | Status: DC | PRN
  Administered 2022-04-04: 80 ug via INTRAVENOUS

## 2022-04-04 MED ORDER — GADOBUTROL 1 MMOL/ML IV SOLN
5.0000 mL | Freq: Once | INTRAVENOUS | Status: AC | PRN
  Administered 2022-04-04: 5 mL via INTRAVENOUS

## 2022-04-04 MED ORDER — LIDOCAINE 2% (20 MG/ML) 5 ML SYRINGE
INTRAMUSCULAR | Status: DC | PRN
  Administered 2022-04-04: 50 mg via INTRAVENOUS

## 2022-04-04 MED ORDER — CHLORHEXIDINE GLUCONATE 0.12 % MT SOLN
OROMUCOSAL | Status: AC
  Administered 2022-04-04: 15 mL via OROMUCOSAL
  Filled 2022-04-04: qty 15

## 2022-04-04 MED ORDER — FENTANYL CITRATE (PF) 100 MCG/2ML IJ SOLN: 25.0000 ug | INTRAMUSCULAR | Status: DC | PRN

## 2022-04-04 MED ORDER — PROMETHAZINE HCL 25 MG/ML IJ SOLN: 6.2500 mg | INTRAMUSCULAR | Status: DC | PRN

## 2022-04-04 MED ORDER — ROCURONIUM BROMIDE 10 MG/ML (PF) SYRINGE
PREFILLED_SYRINGE | INTRAVENOUS | Status: DC | PRN
  Administered 2022-04-04: 40 mg via INTRAVENOUS

## 2022-04-04 MED ORDER — PHENYLEPHRINE HCL-NACL 20-0.9 MG/250ML-% IV SOLN
INTRAVENOUS | Status: DC | PRN
  Administered 2022-04-04: 20 ug/min via INTRAVENOUS

## 2022-04-04 MED ORDER — LACTATED RINGERS IV SOLN
INTRAVENOUS | Status: DC
Start: 2022-04-04 — End: 2022-04-04

## 2022-04-04 NOTE — Anesthesia Procedure Notes (Signed)
Procedure Name: Intubation Date/Time: 04/04/2022 11:19 AM  Performed by: Harden Mo, CRNAPre-anesthesia Checklist: Patient identified, Emergency Drugs available, Suction available and Patient being monitored Patient Re-evaluated:Patient Re-evaluated prior to induction Oxygen Delivery Method: Circle System Utilized Preoxygenation: Pre-oxygenation with 100% oxygen Induction Type: IV induction Ventilation: Mask ventilation without difficulty Laryngoscope Size: Miller and 2 Grade View: Grade I Tube type: Oral Tube size: 7.0 mm Number of attempts: 1 Airway Equipment and Method: Stylet and Oral airway Placement Confirmation: ETT inserted through vocal cords under direct vision, positive ETCO2 and breath sounds checked- equal and bilateral Secured at: 22 cm Tube secured with: Tape Dental Injury: Teeth and Oropharynx as per pre-operative assessment

## 2022-04-04 NOTE — Transfer of Care (Signed)
Immediate Anesthesia Transfer of Care Note  Patient: Melanie Hogan  Procedure(s) Performed: MRI WITH ANESTHESIA  Patient Location: PACU  Anesthesia Type:General  Level of Consciousness: awake, alert  and oriented  Airway & Oxygen Therapy: Patient Spontanous Breathing  Post-op Assessment: Report given to RN, Post -op Vital signs reviewed and stable and Patient moving all extremities X 4  Post vital signs: Reviewed and stable  Last Vitals:  Vitals Value Taken Time  BP 109/72   Temp    Pulse 52   Resp 12   SpO2 94     Last Pain:  Vitals:   04/04/22 1027  TempSrc: Oral  PainSc:          Complications: No notable events documented.

## 2022-04-04 NOTE — Progress Notes (Signed)
[facility] PROGRESS NOTE    Progress Note  Melanie Hogan  TKW:[id number] DOB: [birth year] DOA: 04/01/2022 PCP: [name], DO     Brief Narrative:   Melanie Hogan is an 51 y.o. female past medical history significant for ALS, anxiety and depression, melanoma of her back s/p resection with metastases to her brain, chronic thoracic vertebral compression fracture presents with worsening headache and ambulatory dysfunction for which she uses a walker, she has failed ALS treatment due to GI upset.  Recently started on beta-blocker started developing tremors and weakness and she will no longer use a walker.  She also developed headache and a collection of nausea no vision changes or hearing changes.  She went and saw her neurologist, CT of the head on 03/31/2022 was suspicious for multiple intracranial lesions with edema suspicious for metastatic disease along with midline shift.  CT of the chest, abdomen and pelvis done on 04/01/2022 showed multiple pulmonary nodules concerning for metastatic disease and irregular adenopathy.  Neurosurgery was consulted recommended MRI without contrast.  Assessment/Plan:   Melanoma metastatic to brain [facility]) Neurosurgery related she is not a candidate for palliative resection. MRI of the brain with sedation pending. Continue IV Decadron and Keppra for seizure prophylaxis. Oncology recommended outpatient follow-up with melanoma specialist Dr. Alen Blew. Radiation oncology on board and awaiting MRI for further recommendations. Avoid chemical DVT prophylaxis due to her melanoma. Patient met with palliative care and she is now DNR no feeding tube or tracheostomy. The family would like to talk to the oncologist to discuss her options.  Anxiety/  Moderate episode of recurrent major depressive disorder (HCC) Continue SSRI.  Recurrent melanoma: With significant diffuse metastases.  Collapsed vertebra, not elsewhere classified, thoracic region,  initial encounter for fracture ([place]) Noted  ALS (amyotrophic lateral sclerosis) ([place]) - following w/ neurology  Noted.  DVT prophylaxis: scd Family Communication:none Status is: Inpatient Remains inpatient appropriate because: Metastatic brain cancer    Code Status:     Code Status Orders  (From admission, onward)           Start     Ordered   04/01/22 1442  Full code  Continuous        04/01/22 1446           Code Status History     This patient has a current code status but no historical code status.         IV Access:   Peripheral IV   Procedures and diagnostic studies:   No results found.   Medical Consultants:   None.   Subjective:    Melanie Hogan uncomfortable today not in a good mood, with labile emotion  Objective:    Vitals:   04/03/22 2000 04/03/22 2300 04/04/22 0400 04/04/22 0726  BP: 117/70 100/62 94/62 103/63  Pulse: (!) 58 (!) 55 (!) 50 (!) 46  Resp: 18 16 16 20   Temp: 98.5 F (36.9 C) 97.8 F (36.6 C) 98.3 F (36.8 C) 97.7 F (36.5 C)  TempSrc: Oral Oral Oral Oral  SpO2: 95% 95% 95% 96%  Weight:      Height:       SpO2: 96 %   Intake/Output Summary (Last 24 hours) at 04/04/2022 0934 Last data filed at 04/03/2022 1825 Gross per 24 hour  Intake 480 ml  Output 400 ml  Net 80 ml    Filed Weights   04/01/22 1127  Weight: 54.4 kg    Exam: General exam: In no acute  distress. Respiratory system: Good air movement and clear to auscultation. Cardiovascular system: S1 & S2 heard, RRR. No JVD. Gastrointestinal system: Abdomen is nondistended, soft and nontender.  Extremities: No pedal edema. Skin: No rashes, lesions or ulcers Data Reviewed:    Labs: Basic Metabolic Panel: Recent Labs  Lab 04/01/22 1256  NA 141  K 4.2  CL 109  CO2 21*  GLUCOSE 126*  BUN 16  CREATININE 1.04*  CALCIUM 9.9    GFR Estimated Creatinine Clearance: 56.2 mL/min (A) (by C-G formula based on SCr of 1.04 mg/dL  (H)). Liver Function Tests: Recent Labs  Lab 04/01/22 1256  AST 19  ALT 15  ALKPHOS 70  BILITOT 0.4  PROT 6.4*  ALBUMIN 3.8    No results for input(s): "LIPASE", "AMYLASE" in the last 168 hours. No results for input(s): "AMMONIA" in the last 168 hours. Coagulation profile No results for input(s): "INR", "PROTIME" in the last 168 hours. COVID-19 Labs  No results for input(s): "DDIMER", "FERRITIN", "LDH", "CRP" in the last 72 hours.  No results found for: "SARSCOV2NAA"  CBC: Recent Labs  Lab 04/01/22 1256  WBC 5.5  NEUTROABS 4.2  HGB 12.8  HCT 37.6  MCV 91.3  PLT 233    Cardiac Enzymes: No results for input(s): "CKTOTAL", "CKMB", "CKMBINDEX", "TROPONINI" in the last 168 hours. BNP (last 3 results) No results for input(s): "PROBNP" in the last 8760 hours. CBG: No results for input(s): "GLUCAP" in the last 168 hours. D-Dimer: No results for input(s): "DDIMER" in the last 72 hours. Hgb A1c: No results for input(s): "HGBA1C" in the last 72 hours. Lipid Profile: No results for input(s): "CHOL", "HDL", "LDLCALC", "TRIG", "CHOLHDL", "LDLDIRECT" in the last 72 hours. Thyroid function studies: No results for input(s): "TSH", "T4TOTAL", "T3FREE", "THYROIDAB" in the last 72 hours.  Invalid input(s): "FREET3" Anemia work up: No results for input(s): "VITAMINB12", "FOLATE", "FERRITIN", "TIBC", "IRON", "RETICCTPCT" in the last 72 hours. Sepsis Labs: Recent Labs  Lab 04/01/22 1256  WBC 5.5    Microbiology No results found for this or any previous visit (from the past 240 hour(s)).   Medications:    baclofen  10 mg Oral TID   dexamethasone (DECADRON) injection  4 mg Intravenous Q6H   escitalopram  20 mg Oral QHS   lactose free nutrition  237 mL Oral BID BM   levETIRAcetam  500 mg Oral BID   multivitamin with minerals  1 tablet Oral Daily   pantoprazole  20 mg Oral Daily   Continuous Infusions:    LOS: 3 days   Charlynne Cousins  [facility]  04/04/2022, 9:34 AM

## 2022-04-04 NOTE — Care Management Important Message (Signed)
Important Message  Patient Details  Name: Melanie Hogan MRN: [id number] Date of Birth: Jul 13, 1971   Medicare Important Message Given:  Yes     Orbie Pyo 04/04/2022, 1:41 PM

## 2022-04-04 NOTE — Progress Notes (Signed)
NIF -25 VC  1.96

## 2022-04-04 NOTE — Progress Notes (Signed)
[place]         [phone] ________________________________  Initial inpatient Consultation  Name: Melanie Hogan MRN: [id number]  Date: 04/01/2022  DOB: 04/05/1971  REFERRING PHYSICIAN: No ref. provider found  DIAGNOSIS: 51 yo woman with at least 29 brain metastases from [place]   1. Cerebral herniation (HCC)  G93.5     2. Malignancy ([place])  C80.1     3. Melanoma metastatic to brain [facility])  C79.31       HISTORY OF PRESENT ILLNESS::Melanie Hogan is a 51 y.o. female with a past medical history significant for ALS and Stage IIA (cT3a, cN0, cN0) melanoma status post resection with her medical care at [facility].  She recently developed tremors and weakness over the past 2 weeks.  She subsequently developed headaches and had a CT of the head performed at [facility] on 03/31/2022 which showed multiple round lesions with central hypoattenuation and peripheral hyperattenuation with associated vasogenic edema.  She also had a CT of the chest/abdomen/pelvis with contrast performed on 04/01/2022 which showed numerous pulmonary nodules concerning for metastases, irregular left hilar adenopathy also concerning for metastatic disease.  The patient was seen by neurosurgery who recommended MRI of the brain with and without contrast.  They felt that she was unlikely to benefit from palliative resection.  Palliative care has been consulted and CODE STATUS has been DNR.      The patient was seen in her hospital room today following MRI.  Multiple family members at the bedside.  The patient reports that she was having headaches at home along with slurred speech.  At baseline, she does have some slurred speech secondary to ALS but the slurred speech more recently was worsening.  She reports that recently she has been overall declining.  The patient is single and has no children.  She lives with her brother and mother lives very close to their house.  At baseline,  she is in a wheelchair.   She required anesthesia to tolerate brain MRI completed earlier today.  This study demonstrated 3 punctate cerebellar lesions, and there are at least 26 supratentorial lesions. The largest is a 2.7 cm centrally necrotic mass in the right temporal lobe with extensive associated vasogenic edema which extends superiorly into the white matter tracks at the level of the basal ganglia.  PREVIOUS RADIATION THERAPY: No  Past Medical History:  Diagnosis Date   ALS (amyotrophic lateral sclerosis) ([place])    Anxiety    At high risk for falls    Depression    Migraines    Tobacco use disorder   :   Past Surgical History:  Procedure Laterality Date   ABDOMINAL HYSTERECTOMY     LAPAROSCOPIC NEPHRECTOMY Left 08/2016   donor   TUBAL LIGATION    :   Current Facility-Administered Medications:    baclofen (LIORESAL) tablet 10 mg, 10 mg, Oral, TID, Wynetta Fines T, MD, 10 mg at 04/04/22 1442   bisacodyl (DULCOLAX) EC tablet 5 mg, 5 mg, Oral, Daily PRN, Wynetta Fines T, MD, 5 mg at 04/03/22 0949   butalbital-acetaminophen-caffeine (FIORICET) 50-325-40 MG per tablet 1 tablet, 1 tablet, Oral, Q4H PRN, Wynetta Fines T, MD, 1 tablet at 04/01/22 1803   dexamethasone (DECADRON) injection 4 mg, 4 mg, Intravenous, Q6H, Lowy, Cooper A, PA, 4 mg at 04/04/22 0559   diazepam (VALIUM) tablet 5 mg, 5 mg, Oral, TID PRN, Wynetta Fines T, MD, 5 mg at 04/04/22 1442  escitalopram (LEXAPRO) tablet 20 mg, 20 mg, Oral, QHS, Zhang, Ping T, MD, 20 mg at 04/03/22 2148   ipratropium-albuterol (DUONEB) 0.5-2.5 (3) MG/3ML nebulizer solution 3 mL, 3 mL, Nebulization, Q4H PRN, Wynetta Fines T, MD   lactose free nutrition (BOOST PLUS) liquid 237 mL, 237 mL, Oral, BID BM, Charlynne Cousins, MD, 237 mL at 04/04/22 1413   levETIRAcetam (KEPPRA) tablet 500 mg, 500 mg, Oral, BID, Wynetta Fines T, MD, 500 mg at 04/04/22 1442   magnesium citrate solution 1 Bottle, 1 Bottle, Oral, Once PRN, Lequita Halt, MD    multivitamin with minerals tablet 1 tablet, 1 tablet, Oral, Daily, Charlynne Cousins, MD, 1 tablet at 04/03/22 0925   ondansetron (ZOFRAN) injection 4 mg, 4 mg, Intravenous, Q6H PRN, Wynetta Fines T, MD   pantoprazole (PROTONIX) EC tablet 20 mg, 20 mg, Oral, Daily, Wynetta Fines T, MD, 20 mg at 04/03/22 3419   traZODone (DESYREL) tablet 150 mg, 150 mg, Oral, QHS PRN, Wynetta Fines T, MD, 150 mg at 04/03/22 2152:   Allergies  Allergen Reactions   Morphine Other (See Comments), Itching and Nausea And Vomiting    Per pt was also violent  N/V, ineffective   Riluzole Nausea Only  :   Family History  Problem Relation Age of Onset   Healthy Mother    Cancer Father   :   Social History   Socioeconomic History   Marital status: Single    Spouse name: Not on file   Number of children: Not on file   Years of education: Not on file   Highest education level: Not on file  Occupational History   Not on file  Tobacco Use   Smoking status: Every Day    Packs/day: 0.50    Years: 20.00    Total pack years: 10.00    Types: Cigarettes   Smokeless tobacco: Never  Vaping Use   Vaping Use: Never used  Substance and Sexual Activity   Alcohol use: Not Currently    Comment: 1-2 / year   Drug use: Not Currently   Sexual activity: Not Currently    Birth control/protection: Abstinence, Surgical  Other Topics Concern   Not on file  Social History Narrative   Not on file   Social Determinants of Health   Financial Resource Strain: Not on file  Food Insecurity: Not on file  Transportation Needs: Not on file  Physical Activity: Not on file  Stress: Not on file  Social Connections: Not on file  Intimate Partner Violence: Not on file  :  REVIEW OF SYSTEMS:  A 15 point review of systems is documented in the electronic medical record. This was obtained by the nursing staff. However, I reviewed this with the patient to discuss relevant findings and make appropriate changes.  Pertinent items are  noted in HPI.   PHYSICAL EXAM:  Blood pressure 108/65, pulse (!) 54, temperature 98.4 F (36.9 C), temperature source Oral, resp. rate 14, height '5\' 6"'$  (1.676 m), weight 120 lb (54.4 kg), SpO2 94 %. Per Med-onc General: Thin female, sitting up in bed, no distress.   Eyes:  no scleral icterus.   ENT:  There were no oropharyngeal lesions.     Lymphatics:  Negative cervical, supraclavicular or axillary adenopathy.   Respiratory: lungs were clear bilaterally without wheezing or crackles.   Cardiovascular: Regular rate and rhythm, no lower extremity edema. GI: Positive bowel sounds, soft, nontender.   Skin exam was without echymosis, petichae.  Neuro exam was nonfocal. Patient was alert and oriented.     KPS = 50  100 - Normal; no complaints; no evidence of disease. 90   - Able to carry on normal activity; minor signs or symptoms of disease. 80   - Normal activity with effort; some signs or symptoms of disease. 56   - Cares for self; unable to carry on normal activity or to do active work. 60   - Requires occasional assistance, but is able to care for most of his personal needs. 50   - Requires considerable assistance and frequent medical care. 55   - Disabled; requires special care and assistance. 37   - Severely disabled; hospital admission is indicated although death not imminent. 25   - Very sick; hospital admission necessary; active supportive treatment necessary. 10   - Moribund; fatal processes progressing rapidly. 0     - Dead  Karnofsky DA, Abelmann [place], Craver LS and Burchenal JH [phone]) The use of the nitrogen mustards in the palliative treatment of carcinoma: with particular reference to bronchogenic carcinoma Cancer 1 634-56  LABORATORY DATA:  Lab Results  Component Value Date   WBC 5.5 04/01/2022   HGB 12.8 04/01/2022   HCT 37.6 04/01/2022   MCV 91.3 04/01/2022   PLT 233 04/01/2022   Lab Results  Component Value Date   NA 141 04/01/2022   K 4.2 04/01/2022   CL 109  04/01/2022   CO2 21 (L) 04/01/2022   Lab Results  Component Value Date   ALT 15 04/01/2022   AST 19 04/01/2022   ALKPHOS 70 04/01/2022   BILITOT 0.4 04/01/2022     RADIOGRAPHY: MR BRAIN W WO CONTRAST  Result Date: 04/04/2022 CLINICAL DATA:  Suspected brain metastases. History of melanoma and ALS. EXAM: MRI HEAD WITHOUT AND WITH CONTRAST TECHNIQUE: Multiplanar, multiecho pulse sequences of the brain and surrounding structures were obtained without and with intravenous contrast. CONTRAST:  64m GADAVIST GADOBUTROL 1 MMOL/ML IV SOLN COMPARISON:  Head MRI 12/21/2019 FINDINGS: Brain: There are numerous brain lesions demonstrating both solid and peripheral enhancement consistent with metastases, and these are annotated on series 10. Many demonstrate intrinsic T1 hyperintensity which may reflect hemorrhage or melanin. There are 3 punctate cerebellar lesions, and there are at least 26 supratentorial lesions. The largest is a 2.7 cm centrally necrotic mass in the right temporal lobe with extensive associated vasogenic edema which extends superiorly into the white matter tracks at the level of the basal ganglia. There is regional sulcal effacement, partial effacement of the temporal horn of the right lateral ventricle, medial displacement of the right uncus with partial effacement of the basilar cisterns, and 5 mm of leftward midline shift. Additional notable lesions measure 1.9 cm in the right parietal lobe and 1.5 cm in the left parietal lobe, each with associated moderate vasogenic edema. There is no cerebellar edema. Mild dilatation of the left lateral ventricle could reflect early trapping. There is no acute infarct or extra-axial fluid collection. Vascular: Major intracranial vascular flow voids are preserved. Skull and upper cervical spine: Unremarkable bone marrow signal. Sinuses/Orbits: Unremarkable orbits. Minimal mucosal thickening in the paranasal sinuses. Clear mastoid air cells. Other: None.  IMPRESSION: 1. Numerous brain metastases as detailed above. 2. Extensive right cerebral hemispheric edema with 5 mm of leftward midline shift. Electronically Signed   By: ALogan BoresM.D.   On: 04/04/2022 12:43      IMPRESSION: 51yo woman with at least 29 brain metastases from melanoma and  a history of ALS  PLAN:Today, I talked to the patient and family about the findings and work-up thus far.  We discussed the natural history of brain metastases and general treatment, highlighting the role of radiotherapy in the management.  We discussed the available radiation techniques, and focused on the details of logistics and delivery.  We reviewed the anticipated acute and late sequelae associated with radiation in this setting.  The patient was encouraged to ask questions that I answered to the best of my ability.    Brain MRI images, show numerous brain metastases with the largest almost 3 cm in the right temporal lobe.  The patient would not be a candidate for stereotactic radiosurgery.  She could potentially receive whole brain irradiation with palliative intent.  We also discussed prognosis with and without radiation.  I explained that going without radiation would be associated with shorter survival, but, that death from brain metastases is not painful and can be a peaceful experience.  We talked about how some patients respond well to radiation and enjoy longer survivals, while others do not respond well.  We talked about hair loss, memory effects and fatigue related to radiation.  At the end of our discussion, she would like to proceed with radiation.  Ideally, we can transfer her to [facility] to initiate whole brain radiation, and then make plans for discharge in the next few days.  I personally spent 60 minutes in this encounter including chart review, reviewing radiological studies, meeting face-to-face with the patient, entering orders and completing documentation.     ------------------------------------------------   Tyler Pita, MD [place]: [phone]  Fax: [phone] [email or web address]  Skype  [email or web address]

## 2022-04-04 NOTE — Progress Notes (Signed)
NIF -15 VC 1.32L

## 2022-04-04 NOTE — Anesthesia Postprocedure Evaluation (Signed)
Anesthesia Post Note  Patient: Melanie Hogan  Procedure(s) Performed: MRI WITH ANESTHESIA     Patient location during evaluation: PACU Anesthesia Type: General Level of consciousness: sedated and patient cooperative Pain management: pain level controlled Vital Signs Assessment: post-procedure vital signs reviewed and stable Respiratory status: spontaneous breathing Cardiovascular status: stable Anesthetic complications: no   No notable events documented.  Last Vitals:  Vitals:   04/04/22 1255 04/04/22 1300  BP: 100/64 101/66  Pulse:  (!) 51  Resp:  15  Temp:  36.4 C  SpO2:  93%    Last Pain:  Vitals:   04/04/22 1255  TempSrc:   PainSc: 0-No pain                 Nolon Nations

## 2022-04-04 NOTE — Anesthesia Preprocedure Evaluation (Addendum)
Anesthesia Evaluation  Patient identified by MRN, date of birth, ID band Patient awake    Reviewed: Allergy & Precautions, NPO status , Patient's Chart, lab work & pertinent test results  History of Anesthesia Complications (+) POST - OP SPINAL HEADACHE and history of anesthetic complications  Airway Mallampati: II  TM Distance: >3 FB Neck ROM: Full    Dental  (+) Dental Advisory Given, Teeth Intact   Pulmonary Current Smoker and Patient abstained from smoking.,    Pulmonary exam normal breath sounds clear to auscultation       Cardiovascular negative cardio ROS Normal cardiovascular exam Rhythm:Regular Rate:Normal     Neuro/Psych  Headaches, PSYCHIATRIC DISORDERS Anxiety Depression  Neuromuscular disease    GI/Hepatic negative GI ROS, Neg liver ROS,   Endo/Other  negative endocrine ROS  Renal/GU Renal disease     Musculoskeletal negative musculoskeletal ROS (+)   Abdominal   Peds  Hematology negative hematology ROS (+)   Anesthesia Other Findings   Reproductive/Obstetrics                            Anesthesia Physical Anesthesia Plan  ASA: 4  Anesthesia Plan: General   Post-op Pain Management: Minimal or no pain anticipated   Induction: Intravenous  PONV Risk Score and Plan: 2 and Ondansetron, Treatment may vary due to age or medical condition and Dexamethasone  Airway Management Planned: Oral ETT  Additional Equipment:   Intra-op Plan:   Post-operative Plan: Possible Post-op intubation/ventilation  Informed Consent: I have reviewed the patients History and Physical, chart, labs and discussed the procedure including the risks, benefits and alternatives for the proposed anesthesia with the patient or authorized representative who has indicated his/her understanding and acceptance.   Patient has DNR.  Discussed DNR with patient and Suspend DNR.   Dental advisory  given  Plan Discussed with: CRNA  Anesthesia Plan Comments:        Anesthesia Quick Evaluation

## 2022-04-04 NOTE — Consult Note (Cosign Needed)
[place]  [phone] [phone]  Referral MD: Dr. Charlynne Cousins  Reason for Referral: Brain lesions, numerous pulmonary nodules concerning for metastases, irregular left hilar adenopathy.  HPI: Melanie Hogan is a 51 year old female with a past medical history significant for ALS, anxiety/depression, history of stage IIa (cT3a, cN0, cN0) melanoma status post resection, chronic thoracic vertebral compression fracture.  She has recently developed tremors and weakness over the past 2 weeks.  She subsequently developed headaches and had a CT of the head performed at [facility] on 03/31/2022 which showed multiple round lesions with central hypoattenuation and peripheral hyperattenuation with associated vasogenic edema.  She also had a CT of the chest/abdomen/pelvis with contrast performed on 04/01/2022 which showed numerous pulmonary nodules concerning for metastases, irregular left hilar adenopathy also concerning for metastatic disease.  The patient was seen by neurosurgery who recommended MRI of the brain with and without contrast.  They felt that she was unlikely to benefit from palliative resection.  Palliative care has been consulted and CODE STATUS has been DNR.  Additionally, the patient is indicated that she would not want a feeding tube or tracheostomy.  Radiation oncology has reviewed her chart and are awaiting the MRI results before making further recommendations.  She was last seen by medical oncology at [facility] on 05/27/2020.  According to this note, her oncologist was "unwilling to even consider the possibility of any adjuvant therapy and her immobile condition and with her heavy smoking and marijuana use."  The patient was seen in her hospital room today following MRI.  Multiple family members at the bedside.  The patient reports that she was having headaches at home along with slurred speech.  At baseline,  she does have some slurred speech secondary to ALS but the slurred speech more recently was worsening.  She reports that recently she has been overall declining.  She is not having any chest pain or shortness of breath.  Denies abdominal pain, nausea, vomiting.  Does have some intermittent dysphagia.  No bleeding reported.  The patient is single and has no children.  Lives with her brother and mother lives very close to their house.  At baseline, she is in a wheelchair.  Previously smoked about a pack of cigarettes for 30 years.  Denies alcohol use.  Family history significant for a father with head neck cancer and paternal uncle with lung cancer.  Medical oncology was asked to see the patient make recommendations regarding her pulmonary nodules and history of melanoma.   Past Medical History:  Diagnosis Date   ALS (amyotrophic lateral sclerosis) ([place])    Anxiety    At high risk for falls    Depression    Migraines    Tobacco use disorder   :   Past Surgical History:  Procedure Laterality Date   ABDOMINAL HYSTERECTOMY     LAPAROSCOPIC NEPHRECTOMY Left 08/2016   donor   TUBAL LIGATION    :   Current Facility-Administered Medications  Medication Dose Route Frequency Provider Last Rate Last Admin   baclofen (LIORESAL) tablet 10 mg  10 mg Oral TID Wynetta Fines T, MD   10 mg at 04/03/22 2148   bisacodyl (DULCOLAX) EC tablet 5 mg  5 mg Oral Daily PRN Lequita Halt, MD   5 mg at 04/03/22 0949   butalbital-acetaminophen-caffeine (FIORICET) 50-325-40 MG per tablet 1 tablet  1 tablet Oral Q4H PRN Lequita Halt, MD  1 tablet at 04/01/22 1803   dexamethasone (DECADRON) injection 4 mg  4 mg Intravenous Q6H Valoria, Tamburri A, PA   4 mg at 04/04/22 0559   diazepam (VALIUM) tablet 5 mg  5 mg Oral TID PRN Wynetta Fines T, MD   5 mg at 04/03/22 2152   escitalopram (LEXAPRO) tablet 20 mg  20 mg Oral QHS Wynetta Fines T, MD   20 mg at 04/03/22 2148   ipratropium-albuterol (DUONEB) 0.5-2.5 (3) MG/3ML nebulizer  solution 3 mL  3 mL Nebulization Q4H PRN Wynetta Fines T, MD       lactose free nutrition (BOOST PLUS) liquid 237 mL  237 mL Oral BID BM Charlynne Cousins, MD   237 mL at 04/03/22 1420   levETIRAcetam (KEPPRA) tablet 500 mg  500 mg Oral BID Wynetta Fines T, MD   500 mg at 04/03/22 2148   magnesium citrate solution 1 Bottle  1 Bottle Oral Once PRN Lequita Halt, MD       multivitamin with minerals tablet 1 tablet  1 tablet Oral Daily Charlynne Cousins, MD   1 tablet at 04/03/22 0925   ondansetron (ZOFRAN) injection 4 mg  4 mg Intravenous Q6H PRN Wynetta Fines T, MD       pantoprazole (PROTONIX) EC tablet 20 mg  20 mg Oral Daily Wynetta Fines T, MD   20 mg at 04/03/22 4818   traZODone (DESYREL) tablet 150 mg  150 mg Oral QHS PRN Wynetta Fines T, MD   150 mg at 04/03/22 2152      Allergies  Allergen Reactions   Morphine Other (See Comments), Itching and Nausea And Vomiting    Per pt was also violent  N/V, ineffective   Riluzole Nausea Only  :   Family History  Problem Relation Age of Onset   Healthy Mother    Cancer Father   :   Social History   Socioeconomic History   Marital status: Single    Spouse name: Not on file   Number of children: Not on file   Years of education: Not on file   Highest education level: Not on file  Occupational History   Not on file  Tobacco Use   Smoking status: Every Day    Packs/day: 0.50    Years: 20.00    Total pack years: 10.00    Types: Cigarettes   Smokeless tobacco: Never  Vaping Use   Vaping Use: Never used  Substance and Sexual Activity   Alcohol use: Not Currently    Comment: 1-2 / year   Drug use: Not Currently   Sexual activity: Not Currently    Birth control/protection: Abstinence, Surgical  Other Topics Concern   Not on file  Social History Narrative   Not on file   Social Determinants of Health   Financial Resource Strain: Not on file  Food Insecurity: Not on file  Transportation Needs: Not on file  Physical Activity:  Not on file  Stress: Not on file  Social Connections: Not on file  Intimate Partner Violence: Not on file  :  Review of Systems: A comprehensive 14 point review of systems was negative except as noted in the HPI.  Exam: Patient Vitals for the past 24 hrs:  BP Temp Temp src Pulse Resp SpO2  04/04/22 0726 103/63 97.7 F (36.5 C) Oral (!) 46 20 96 %  04/04/22 0400 94/62 98.3 F (36.8 C) Oral (!) 50 16 95 %  04/03/22 2300 100/62 97.8  F (36.6 C) Oral (!) 55 16 95 %  04/03/22 2000 117/70 98.5 F (36.9 C) Oral (!) 58 18 95 %  04/03/22 1549 110/64 98 F (36.7 C) Oral (!) 57 19 96 %  04/03/22 1211 113/74 98 F (36.7 C) Oral (!) 49 16 96 %    General: Thin female, sitting up in bed, no distress.   Eyes:  no scleral icterus.   ENT:  There were no oropharyngeal lesions.     Lymphatics:  Negative cervical, supraclavicular or axillary adenopathy.   Respiratory: lungs were clear bilaterally without wheezing or crackles.   Cardiovascular: Regular rate and rhythm, no lower extremity edema. GI: Positive bowel sounds, soft, nontender.   Skin exam was without echymosis, petichae.   Neuro exam was nonfocal. Patient was alert and oriented.      Lab Results  Component Value Date   WBC 5.5 04/01/2022   HGB 12.8 04/01/2022   HCT 37.6 04/01/2022   PLT 233 04/01/2022   GLUCOSE 126 (H) 04/01/2022   ALT 15 04/01/2022   AST 19 04/01/2022   NA 141 04/01/2022   K 4.2 04/01/2022   CL 109 04/01/2022   CREATININE 1.04 (H) 04/01/2022   BUN 16 04/01/2022   CO2 21 (L) 04/01/2022    No results found.   No results found.  Pathology:   Surgical Pathology                                Case: [id number]                                  Authorizing Provider:  Karie Georges, MD   Collected:           02/28/2020 1422              Ordering Location:     [facility]    Received:            02/28/2020 1528              Pathologist:           Satira Mccallum, MD                                                               Specimens:   1) - Skin, Excision, UPPER RIGHT BACK MELANOMA EXCISION - STITCH: LONG LATERAL,                     SHORT: SUPERIOR                                                                                     2) - Lymph Node, Sentinel, RIGHT AXILLARY SENTINEL LYMPH NODE  [place]  Final Diagnosis   [facility]  Skin, excision, upper right back: Residual invasive melanoma, margins are clear in sections examined   2.    Sentinel lymph node, right axillary sentinel lymph node: Four lymph nodes, negative for involvement by melanoma (See comment)   Comment: After initial review of the H&E slides, immunohistochemical stains with appropriate controls were performed with Panmel and SOX10 and support the diagnosis. There is a subcapsular nevus identified in one lymph node, which is identified on immunohistochemical stains. This was reviewed by Dr. Craige Cotta, who concurs with the diagnosis.   Electronically signed by Satira Mccallum, MD on 03/04/2020 at 12:51 PM     Assessment and Plan:  This is a 51 year old female with  1.  History of stage IIa melanoma status post resection, now with brain lesions and pulmonary nodules.  Discussed imaging findings with the patient and her family.  Findings concerning for recurrent melanoma.  Radiation oncology has ordered an MRI which was performed earlier today and results are currently pending.  Will await further recommendations from radiation oncology regarding plan for treatment.  Imaging findings most concerning for metastatic melanoma.  However, could have another underlying malignancy.  Will discuss with Dr. Alen Blew whether there is a need for repeat biopsy.  I briefly had a discussion with the patient and her family today that if this is recurrent, metastatic melanoma, first-line treatment is typically immunotherapy.  The patient is not sure if she would want to proceed with  this or not.  2.  ALS.  Followed by neurology at [place].  She has failed Riluzole secondary to nausea and has also failed Radicava due to migraine headaches.  Currently on treatment with baclofen and Valium.   Thank you for this referral.   Mikey Bussing, DNP, AGPCNP-BC, AOCNP

## 2022-04-05 ENCOUNTER — Ambulatory Visit
Admission: RE | Admit: 2022-04-05 | Discharge: 2022-04-05 | Disposition: A | Discharge status: Home / Self Care | Payer: Medicare HMO | Source: Ambulatory Visit | Attending: Radiation Oncology | Admitting: Radiation Oncology

## 2022-04-05 ENCOUNTER — Encounter (HOSPITAL_COMMUNITY): Payer: Self-pay | Admitting: Radiology

## 2022-04-05 DIAGNOSIS — M4854XA Collapsed vertebra, not elsewhere classified, thoracic region, initial encounter for fracture: Secondary | ICD-10-CM | POA: Diagnosis not present

## 2022-04-05 DIAGNOSIS — F331 Major depressive disorder, recurrent, moderate: Secondary | ICD-10-CM | POA: Diagnosis not present

## 2022-04-05 DIAGNOSIS — C7931 Secondary malignant neoplasm of brain: Secondary | ICD-10-CM | POA: Diagnosis not present

## 2022-04-05 DIAGNOSIS — C439 Malignant melanoma of skin, unspecified: Secondary | ICD-10-CM | POA: Insufficient documentation

## 2022-04-05 DIAGNOSIS — Z510 Encounter for antineoplastic radiation therapy: Secondary | ICD-10-CM | POA: Insufficient documentation

## 2022-04-05 DIAGNOSIS — G935 Compression of brain: Secondary | ICD-10-CM | POA: Diagnosis not present

## 2022-04-05 NOTE — Progress Notes (Deleted)
  Radiation Oncology         [phone] ________________________________  Name: Melanie Hogan MRN: [id number]  Date: 04/04/2022  DOB: 05/14/1971  INPATIENT  SIMULATION AND TREATMENT PLANNING NOTE    ICD-10-CM   1. 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  Identity was confirmed.  All relevant records and images related to the planned course of therapy were reviewed.  The patient freely provided informed written consent to proceed with treatment after reviewing the details related to the planned course of therapy. The consent form was witnessed and verified by the simulation staff.  Then, the patient was set-up in a stable reproducible  supine position for radiation therapy.  CT images were obtained.  Surface markings were placed.  The CT images were loaded into the planning software.  Then the target and avoidance structures were contoured.  Treatment planning then occurred.  The radiation prescription was entered and confirmed.  Then, I designed and supervised the construction of a total of 3 medically necessary complex treatment device including a custom made thermoplastic mask used for immobilization, and two MLC collimator apertures for radiotherapy from the right and left side, with independent collimation for each to account for beam divergence.  I have requested : Isodose Plan.    PLAN:  The whole brain will be treated to 30 Gy in 10 fractions.  ________________________________  Sheral Apley Tammi Klippel, M.D.

## 2022-04-05 NOTE — Progress Notes (Signed)
Attempt x2 made to perform NIF/VC, pt not back from [facility].

## 2022-04-05 NOTE — Progress Notes (Signed)
Neurosurgery  I reviewed the MRI brain.  There are numerous intracranial lesions, including two in the right temporal lobe with significant associated vasogenic edema.  Coupled with hilar lymphadenopathy and pulmonary lesions, this appears consistent with metastatic malignancy, with inflammatory disease processes such as neurosarcoidosis less likely.  With her poor baseline functional status and burden of disease, she does not appear to be a good candidate for palliative brain mass resection.  Can consider biopsy of her chest disease and possible palliative brain radiotherapy versus proceeding with palliative care.

## 2022-04-05 NOTE — Progress Notes (Signed)
[facility] PROGRESS NOTE    Progress Note  Melanie Hogan  ONG:[id number] DOB: 06-19-1971 DOA: 04/01/2022 PCP: [name], DO     Brief Narrative:   Melanie Hogan is an 51 y.o. female past medical history significant for ALS, anxiety and depression, melanoma of her back s/p resection with metastases to her brain, chronic thoracic vertebral compression fracture presents with worsening headache and ambulatory dysfunction for which she uses a walker, she has failed ALS treatment due to GI upset.  Recently started on beta-blocker started developing tremors and weakness and she will no longer use a walker.  She also developed headache and a collection of nausea no vision changes or hearing changes.  She went and saw her neurologist, CT of the head on 03/31/2022 was suspicious for multiple intracranial lesions with edema suspicious for metastatic disease along with midline shift.  CT of the chest, abdomen and pelvis done on 04/01/2022 showed multiple pulmonary nodules concerning for metastatic disease and irregular adenopathy.  Neurosurgery was consulted recommended MRI without contrast.  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 MRI of the brain showed numerous metastases with extensive right cerebral hemisphere edema with 5 mm leftward shift. Continue IV Decadron and Keppra for seizure prophylaxis. Oncology has seen the patient awaiting Dr. Further recommendations. Radiation oncology recommend to start radiation at [facility] on 04/05/2022. Avoid chemical DVT prophylaxis due to her melanoma. Patient met with palliative care and she is now DNR no feeding tube or tracheostomy. The family would like to talk to the oncologist to discuss her options.  Anxiety/  Moderate episode of recurrent major depressive disorder (HCC) Continue SSRI.  Recurrent melanoma: With significant diffuse  metastases.  Collapsed vertebra, not elsewhere classified, thoracic region, initial encounter for fracture ([place]) Noted  ALS (amyotrophic lateral sclerosis) ([place]) - following w/ neurology  Noted.  DVT prophylaxis: scd Family Communication:none Status is: Inpatient Remains inpatient appropriate because: Metastatic brain cancer transferred to [facility] for whole brain radiation.    Code Status:     Code Status Orders  (From admission, onward)           Start     Ordered   04/01/22 1442  Full code  Continuous        04/01/22 1446           Code Status History     This patient has a current code status but no historical code status.         IV Access:   Peripheral IV   Procedures and diagnostic studies:   MR BRAIN W WO CONTRAST  Result Date: 04/04/2022 CLINICAL DATA:  Suspected brain metastases. History of melanoma and ALS. EXAM: MRI HEAD WITHOUT AND WITH CONTRAST TECHNIQUE: Multiplanar, multiecho pulse sequences of the brain and surrounding structures were obtained without and with intravenous contrast. CONTRAST:  72m GADAVIST GADOBUTROL 1 MMOL/ML IV SOLN COMPARISON:  Head MRI 12/21/2019 FINDINGS: Brain: There are numerous brain lesions demonstrating both solid and peripheral enhancement consistent with metastases, and these are annotated on series 10. Many demonstrate intrinsic T1 hyperintensity which may reflect hemorrhage or melanin. There are 3 punctate cerebellar lesions, and there are at least 26 supratentorial lesions. The largest is a 2.7 cm centrally necrotic mass in the right temporal lobe with extensive associated vasogenic edema which extends superiorly into the white matter tracks at the level of the basal ganglia. There is regional sulcal effacement, partial effacement  of the temporal horn of the right lateral ventricle, medial displacement of the right uncus with partial effacement of the basilar cisterns, and 5 mm of leftward midline shift. Additional  notable lesions measure 1.9 cm in the right parietal lobe and 1.5 cm in the left parietal lobe, each with associated moderate vasogenic edema. There is no cerebellar edema. Mild dilatation of the left lateral ventricle could reflect early trapping. There is no acute infarct or extra-axial fluid collection. Vascular: Major intracranial vascular flow voids are preserved. Skull and upper cervical spine: Unremarkable bone marrow signal. Sinuses/Orbits: Unremarkable orbits. Minimal mucosal thickening in the paranasal sinuses. Clear mastoid air cells. Other: None. IMPRESSION: 1. Numerous brain metastases as detailed above. 2. Extensive right cerebral hemispheric edema with 5 mm of leftward midline shift. Electronically Signed   By: Logan Bores M.D.   On: 04/04/2022 12:43     Medical Consultants:   None.   Subjective:    Melanie Hogan feels better today.  Objective:    Vitals:   04/04/22 2000 04/04/22 2315 04/05/22 0515 04/05/22 0718  BP: 111/62 98/62 96/64 102/67  Pulse: (!) 59 (!) 54 (!) 56 (!) 49  Resp: _0 Temp: 98.1 F (36.7 C) 98.4 F (36.9 C) 97.6 F (36.4 C) 98.3 F (36.8 C)  TempSrc: Oral Oral Oral Oral  SpO2: 96% 95% 96% 95%  Weight:      Height:       SpO2: 95 %   Intake/Output Summary (Last 24 hours) at 04/05/2022 0826 Last data filed at 04/04/2022 1800 Gross per 24 hour  Intake 640 ml  Output 600 ml  Net 40 ml    Filed Weights   04/01/22 1127  Weight: 54.4 kg    Exam: General exam: In no acute distress. Respiratory system: Good air movement and clear to auscultation. Cardiovascular system: S1 & S2 heard, RRR. No JVD. Gastrointestinal system: Abdomen is nondistended, soft and nontender.  Extremities: No pedal edema. Skin: No rashes, lesions or ulcers Psychiatry: Judgement and insight appear normal. Mood & affect appropriate. Data Reviewed:    Labs: Basic Metabolic Panel: Recent Labs  Lab 04/01/22 1256  NA 141  K 4.2  CL 109  CO2 21*   GLUCOSE 126*  BUN 16  CREATININE 1.04*  CALCIUM 9.9    GFR Estimated Creatinine Clearance: 56.2 mL/min (A) (by C-G formula based on SCr of 1.04 mg/dL (H)). Liver Function Tests: Recent Labs  Lab 04/01/22 1256  AST 19  ALT 15  ALKPHOS 70  BILITOT 0.4  PROT 6.4*  ALBUMIN 3.8    No results for input(s): "LIPASE", "AMYLASE" in the last 168 hours. No results for input(s): "AMMONIA" in the last 168 hours. Coagulation profile No results for input(s): "INR", "PROTIME" in the last 168 hours. COVID-19 Labs  No results for input(s): "DDIMER", "FERRITIN", "LDH", "CRP" in the last 72 hours.  No results found for: "SARSCOV2NAA"  CBC: Recent Labs  Lab 04/01/22 1256  WBC 5.5  NEUTROABS 4.2  HGB 12.8  HCT 37.6  MCV 91.3  PLT 233    Cardiac Enzymes: No results for input(s): "CKTOTAL", "CKMB", "CKMBINDEX", "TROPONINI" in the last 168 hours. BNP (last 3 results) No results for input(s): "PROBNP" in the last 8760 hours. CBG: No results for input(s): "GLUCAP" in the last 168 hours. D-Dimer: No results for input(s): "DDIMER" in the last 72 hours. Hgb A1c: No results for input(s): "HGBA1C" in the last 72 hours. Lipid Profile: No results for input(s): "  CHOL", "HDL", "LDLCALC", "TRIG", "CHOLHDL", "LDLDIRECT" in the last 72 hours. Thyroid function studies: No results for input(s): "TSH", "T4TOTAL", "T3FREE", "THYROIDAB" in the last 72 hours.  Invalid input(s): "FREET3" Anemia work up: No results for input(s): "VITAMINB12", "FOLATE", "FERRITIN", "TIBC", "IRON", "RETICCTPCT" in the last 72 hours. Sepsis Labs: Recent Labs  Lab 04/01/22 1256  WBC 5.5    Microbiology No results found for this or any previous visit (from the past 240 hour(s)).   Medications:    baclofen  10 mg Oral TID   dexamethasone (DECADRON) injection  4 mg Intravenous Q6H   escitalopram  20 mg Oral QHS   lactose free nutrition  237 mL Oral BID BM   levETIRAcetam  500 mg Oral BID   multivitamin with  minerals  1 tablet Oral Daily   pantoprazole  20 mg Oral Daily   Continuous Infusions:    LOS: 4 days   Charlynne Cousins  [facility]  04/05/2022, 8:26 AM

## 2022-04-05 NOTE — Progress Notes (Signed)
  Radiation Oncology         [phone] ________________________________  Name: Melanie Hogan MRN: [id number]  Date: 04/05/2022  DOB: 31-Jan-1971  INPATIENT  SIMULATION AND TREATMENT PLANNING NOTE    ICD-10-CM   1. 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  Identity was confirmed.  All relevant records and images related to the planned course of therapy were reviewed.  The patient freely provided informed written consent to proceed with treatment after reviewing the details related to the planned course of therapy. The consent form was witnessed and verified by the simulation staff.  Then, the patient was set-up in a stable reproducible  supine position for radiation therapy.  CT images were obtained.  Surface markings were placed.  The CT images were loaded into the planning software.  Then the target and avoidance structures were contoured.  Treatment planning then occurred.  The radiation prescription was entered and confirmed.  Then, I designed and supervised the construction of a total of 3 medically necessary complex treatment device including a custom made thermoplastic mask used for immobilization, and two MLC collimator apertures for radiotherapy from the right and left side, with independent collimation for each to account for beam divergence.  I have requested : Isodose Plan.    PLAN:  The whole brain will be treated to 30 Gy in 10 fractions.  ________________________________  Sheral Apley Tammi Klippel, M.D.

## 2022-04-05 NOTE — Progress Notes (Signed)
PT Cancellation Note  Patient Details Name: Melanie Hogan MRN: [id number] DOB: Jul 26, 1971   Cancelled Treatment:    Reason Eval/Treat Not Completed: Other (comment) Pt at [facility]. Will follow up as time allows.   Marguarite Arbour A Kirt Chew 04/05/2022, 12:20 PM Marisa Severin, PT, DPT Acute Rehabilitation Services Secure chat preferred Office [phone]

## 2022-04-05 NOTE — Progress Notes (Signed)
This chaplain responded to PMT NP-Eric consult for creating/updating the Pt. Advance Directive:HCPOA.  The chaplain understands the Pt. transferred to [facility] at the time of the chaplain's visit. The chaplain will update the chaplain at [facility].  Chaplain Sallyanne Kuster [phone]

## 2022-04-05 NOTE — Progress Notes (Signed)
OT Cancellation Note  Patient Details Name: Melanie Hogan MRN: [id number] DOB: 1971/05/23   Cancelled Treatment:    Reason Eval/Treat Not Completed: Other (comment) (pt at [facility] for treatment, will follow up as schedule permits)  Lynnda Child, OTD, OTR/L Acute Rehab [phone]   Kaylyn Lim 04/05/2022, 12:41 PM

## 2022-04-05 NOTE — Progress Notes (Signed)
Patient admitted to [place] in NAD. VSS. Patient oriented to room and call bell. Family called and notified of patients transfer.

## 2022-04-06 ENCOUNTER — Other Ambulatory Visit: Payer: Self-pay

## 2022-04-06 ENCOUNTER — Ambulatory Visit
Admit: 2022-04-06 | Discharge: 2022-04-06 | Disposition: A | Discharge status: Home / Self Care | Payer: Medicare HMO | Attending: Radiation Oncology | Admitting: Radiation Oncology

## 2022-04-06 DIAGNOSIS — C439 Malignant melanoma of skin, unspecified: Secondary | ICD-10-CM | POA: Diagnosis not present

## 2022-04-06 DIAGNOSIS — G1221 Amyotrophic lateral sclerosis: Secondary | ICD-10-CM | POA: Diagnosis not present

## 2022-04-06 DIAGNOSIS — Z66 Do not resuscitate: Secondary | ICD-10-CM | POA: Diagnosis not present

## 2022-04-06 DIAGNOSIS — Z510 Encounter for antineoplastic radiation therapy: Secondary | ICD-10-CM | POA: Diagnosis not present

## 2022-04-06 DIAGNOSIS — Z515 Encounter for palliative care: Secondary | ICD-10-CM | POA: Diagnosis not present

## 2022-04-06 DIAGNOSIS — C7931 Secondary malignant neoplasm of brain: Secondary | ICD-10-CM | POA: Diagnosis not present

## 2022-04-06 DIAGNOSIS — M4854XA Collapsed vertebra, not elsewhere classified, thoracic region, initial encounter for fracture: Secondary | ICD-10-CM | POA: Diagnosis not present

## 2022-04-06 DIAGNOSIS — G935 Compression of brain: Secondary | ICD-10-CM | POA: Diagnosis not present

## 2022-04-06 LAB — RAD ONC ARIA SESSION SUMMARY
Course Elapsed Days: 0
Plan Fractions Treated to Date: 1
Plan Prescribed Dose Per Fraction: 3 Gy
Plan Total Fractions Prescribed: 10
Plan Total Prescribed Dose: 30 Gy
Reference Point Dosage Given to Date: 3 Gy
Reference Point Session Dosage Given: 3 Gy
Session Number: 1

## 2022-04-06 MED ORDER — DEXAMETHASONE 4 MG PO TABS: ORAL_TABLET | ORAL | 0 refills | Status: DC

## 2022-04-06 MED ORDER — POLYETHYLENE GLYCOL 3350 17 G PO PACK: 17.0000 g | PACK | Freq: Two times a day (BID) | ORAL | 0 refills | Status: DC

## 2022-04-06 MED ORDER — MELATONIN 5 MG PO TABS
5.0000 mg | ORAL_TABLET | Freq: Once | ORAL | Status: AC
  Administered 2022-04-06: 5 mg via ORAL
  Filled 2022-04-06: qty 1

## 2022-04-06 MED ORDER — SENNA 8.6 MG PO TABS
1.0000 | ORAL_TABLET | Freq: Every day | ORAL | 0 refills | Status: DC
Start: 2022-04-06 — End: 2022-11-23

## 2022-04-06 MED ORDER — PANTOPRAZOLE SODIUM 20 MG PO TBEC: 20.0000 mg | DELAYED_RELEASE_TABLET | Freq: Every day | ORAL | 0 refills | Status: DC

## 2022-04-06 MED ORDER — OXYCODONE HCL 5 MG PO TABS: 5.0000 mg | ORAL_TABLET | Freq: Four times a day (QID) | ORAL | 0 refills | Status: AC | PRN

## 2022-04-06 MED ORDER — LEVETIRACETAM 500 MG PO TABS: 500.0000 mg | ORAL_TABLET | Freq: Two times a day (BID) | ORAL | 2 refills | Status: DC

## 2022-04-06 NOTE — Progress Notes (Signed)
PT Cancellation Note  Patient Details Name: Melanie Hogan MRN: [id number] DOB: 11-13-1970   Cancelled Treatment:    Reason Eval/Treat Not Completed: Patient declined, no reason specified (Pt reports she is tired and has radiation later today and may discharge home. Will follow up at later date/time as schedule allows and pt able. PT eval recommended HHPT on last visit, currernt rec remains appropraite.)   Verner Mould, DPT Acute Rehabilitation Services Office [phone] Pager [phone]  04/06/22 10:03 AM

## 2022-04-06 NOTE — Plan of Care (Signed)

## 2022-04-06 NOTE — Progress Notes (Signed)
Pt refused to do NIF and VC

## 2022-04-06 NOTE — Progress Notes (Signed)
OT Cancellation Note  Patient Details Name: DANILLE OPPEDISANO MRN: [id number] DOB: 04-03-1971   Cancelled Treatment:    Reason Eval/Treat Not Completed: Fatigue/lethargy limiting ability to participate. Patient declined therapy citing fatigue and lack of sleep. Reports radiation at 1230. Will f/u as able.   Joon Pohle L Panda Crossin 04/06/2022, 10:38 AM

## 2022-04-06 NOTE — Discharge Summary (Signed)
Physician Discharge Summary  Melanie Hogan IWL:[id number] DOB: 21-Feb-1971 DOA: 04/01/2022  PCP: [name], DO  Admit date: 04/01/2022 Discharge date: 04/06/2022  Admitted From: Home Disposition: Home with family  Recommendations for Outpatient Follow-up:  Radiation oncology follow-up as in the schedule.  Home Health: N/A Equipment/Devices: Already available at home  Discharge Condition: Fair CODE STATUS: DNR Diet recommendation: Regular diet, aspiration precautions  Discharge summary:  51 year old female with history of ALS, anxiety and depression, melanoma of her back status postresection and now metastasis to brain presented with headache and unable to stand up even with a walker.  She was seen by neurologist, CT head was suspicious for multiple intracranial lesions with edema, CT scan of the chest abdomen pelvis showed multiple pulmonary nodules concerning for metastatic disease.  MRI showed innumerable brain mets and was admitted to the hospital for treatment planning.  Melanoma metastatic to brain Seen by neurosurgery, not a candidate for palliative resection Treated with IV Decadron and Keppra for seizure prophylaxis.  Some clinical improvement. Seen by radiation oncology and started on radiation treatment, day 2/10 radiation treatment today. Met with palliative care, DNR but want to continue radiations. Lives at home, now basically bedbound.  Patient and family desire to go home today.  Plan:  Patient with improved headache, neurologically stable today with chronic debility and desires to go home. We will discharge patient home on dexamethasone 4 mg 4 times a day as she is taking in the hospital with slow taper over next 12 days.  She will be seen by oncology and may benefit with ongoing steroids. She was started on Keppra prophylaxis, we will continue with that, prescribed. Patient desired to have stronger pain medication, she does need stronger opiates for pain  relief, will prescribe. Family will bring her for radiation treatments and they do have the schedule. She will benefit with ongoing palliative care follow-up and possibly hospice in near future and they will discuss with palliative provider when they come home. Fairly stable to go home with family support.    Discharge Diagnoses:  Principal Problem:   Melanoma metastatic to brain [facility]) Active Problems:   Moderate episode of recurrent major depressive disorder (HCC)   Malignant melanoma of torso excluding breast ([place])   Collapsed vertebra, not elsewhere classified, thoracic region, initial encounter for fracture ([place])   ALS (amyotrophic lateral sclerosis) ([place]) - following w/ neurology     Discharge Instructions  Discharge Instructions     Diet general   Complete by: As directed    Increase activity slowly   Complete by: As directed       Allergies as of 04/06/2022       Reactions   Morphine Other (See Comments), Itching, Nausea And Vomiting   Per pt was also violent  N/V, ineffective   Riluzole Nausea Only        Medication List     TAKE these medications    baclofen 10 MG tablet Commonly known as: LIORESAL Take 10 mg by mouth 3 (three) times daily.   dexamethasone 4 MG tablet Commonly known as: DECADRON 1 tab QID for 3 days 1 tab TID for 3 days 1 tab BID for 3 days 1 tab Daily to continue   diazepam 5 MG tablet Commonly known as: VALIUM Take 5 mg by mouth 3 (three) times daily as needed for anxiety.   escitalopram 20 MG tablet Commonly known as: LEXAPRO Take 1 tablet (20 mg total) by mouth at bedtime.   levETIRAcetam  500 MG tablet Commonly known as: KEPPRA Take 1 tablet (500 mg total) by mouth 2 (two) times daily.   oxyCODONE 5 MG immediate release tablet Commonly known as: Roxicodone Take 1 tablet (5 mg total) by mouth every 6 (six) hours as needed for up to 5 days.   pantoprazole 20 MG tablet Commonly known as: PROTONIX Take 1 tablet (20 mg  total) by mouth daily. Start taking on: April 07, 2022   polyethylene glycol 17 g packet Commonly known as: MiraLax Take 17 g by mouth 2 (two) times daily.   senna 8.6 MG Tabs tablet Commonly known as: SENOKOT Take 1 tablet (8.6 mg total) by mouth daily.   traZODone 150 MG tablet Commonly known as: DESYREL Take 1 tablet (150 mg total) by mouth at bedtime as needed. for sleep        Allergies  Allergen Reactions   Morphine Other (See Comments), Itching and Nausea And Vomiting    Per pt was also violent  N/V, ineffective   Riluzole Nausea Only    Consultations: Oncology Radiation oncology Palliative care   Procedures/Studies: MR BRAIN W WO CONTRAST  Result Date: 04/04/2022 CLINICAL DATA:  Suspected brain metastases. History of melanoma and ALS. EXAM: MRI HEAD WITHOUT AND WITH CONTRAST TECHNIQUE: Multiplanar, multiecho pulse sequences of the brain and surrounding structures were obtained without and with intravenous contrast. CONTRAST:  55m GADAVIST GADOBUTROL 1 MMOL/ML IV SOLN COMPARISON:  Head MRI 12/21/2019 FINDINGS: Brain: There are numerous brain lesions demonstrating both solid and peripheral enhancement consistent with metastases, and these are annotated on series 10. Many demonstrate intrinsic T1 hyperintensity which may reflect hemorrhage or melanin. There are 3 punctate cerebellar lesions, and there are at least 26 supratentorial lesions. The largest is a 2.7 cm centrally necrotic mass in the right temporal lobe with extensive associated vasogenic edema which extends superiorly into the white matter tracks at the level of the basal ganglia. There is regional sulcal effacement, partial effacement of the temporal horn of the right lateral ventricle, medial displacement of the right uncus with partial effacement of the basilar cisterns, and 5 mm of leftward midline shift. Additional notable lesions measure 1.9 cm in the right parietal lobe and 1.5 cm in the left parietal  lobe, each with associated moderate vasogenic edema. There is no cerebellar edema. Mild dilatation of the left lateral ventricle could reflect early trapping. There is no acute infarct or extra-axial fluid collection. Vascular: Major intracranial vascular flow voids are preserved. Skull and upper cervical spine: Unremarkable bone marrow signal. Sinuses/Orbits: Unremarkable orbits. Minimal mucosal thickening in the paranasal sinuses. Clear mastoid air cells. Other: None. IMPRESSION: 1. Numerous brain metastases as detailed above. 2. Extensive right cerebral hemispheric edema with 5 mm of leftward midline shift. Electronically Signed   By: ALogan BoresM.D.   On: 04/04/2022 12:43   (Echo, Carotid, EGD, Colonoscopy, ERCP)    Subjective: Patient was seen and examined.  Early morning rounds Brother was at the bedside and patient was going to radiology.  Patient told me her headaches are better now after steroids. I was called by family to discuss with them after her radiation treatment that they wish her to go home because she has better bed at home and has more help.  Patient would like to go home. I discussed with family about medications, updated oncology team and discharged patient home.   Discharge Exam: Vitals:   04/06/22 0501 04/06/22 1312  BP: 107/62 113/70  Pulse: (!) 45 (!) 50  Resp: 18 18  Temp: 97.8 F (36.6 C) 97.7 F (36.5 C)  SpO2: 93% 94%   Vitals:   04/05/22 1715 04/05/22 2201 04/06/22 0501 04/06/22 1312  BP: 125/72 (!) 93/58 107/62 113/70  Pulse: 73 60 (!) 45 (!) 50  Resp: 19 18 18 18   Temp: 100.2 F (37.9 C) 98 F (36.7 C) 97.8 F (36.6 C) 97.7 F (36.5 C)  TempSrc: Oral Oral Oral Oral  SpO2: 99% 94% 93% 94%  Weight:      Height:        General: Pt is alert, awake, not in acute distress Frail and debilitated.  Sick looking.  On room air. Patient has some dysarthria, generalized weakness but no focal deficit.  Upper extremities are stronger than lower  extremities. Looks anxious. Cardiovascular: RRR, S1/S2 +, no rubs, no gallops Respiratory: CTA bilaterally, no wheezing, no rhonchi Abdominal: Soft, NT, ND, bowel sounds + Extremities: no edema, no cyanosis    The results of significant diagnostics from this hospitalization (including imaging, microbiology, ancillary and laboratory) are listed below for reference.     Microbiology: No results found for this or any previous visit (from the past 240 hour(s)).   Labs: BNP (last 3 results) No results for input(s): "BNP" in the last 8760 hours. Basic Metabolic Panel: Recent Labs  Lab 04/01/22 1256  NA 141  K 4.2  CL 109  CO2 21*  GLUCOSE 126*  BUN 16  CREATININE 1.04*  CALCIUM 9.9   Liver Function Tests: Recent Labs  Lab 04/01/22 1256  AST 19  ALT 15  ALKPHOS 70  BILITOT 0.4  PROT 6.4*  ALBUMIN 3.8   No results for input(s): "LIPASE", "AMYLASE" in the last 168 hours. No results for input(s): "AMMONIA" in the last 168 hours. CBC: Recent Labs  Lab 04/01/22 1256  WBC 5.5  NEUTROABS 4.2  HGB 12.8  HCT 37.6  MCV 91.3  PLT 233   Cardiac Enzymes: No results for input(s): "CKTOTAL", "CKMB", "CKMBINDEX", "TROPONINI" in the last 168 hours. BNP: Invalid input(s): "POCBNP" CBG: No results for input(s): "GLUCAP" in the last 168 hours. D-Dimer No results for input(s): "DDIMER" in the last 72 hours. Hgb A1c No results for input(s): "HGBA1C" in the last 72 hours. Lipid Profile No results for input(s): "CHOL", "HDL", "LDLCALC", "TRIG", "CHOLHDL", "LDLDIRECT" in the last 72 hours. Thyroid function studies No results for input(s): "TSH", "T4TOTAL", "T3FREE", "THYROIDAB" in the last 72 hours.  Invalid input(s): "FREET3" Anemia work up No results for input(s): "VITAMINB12", "FOLATE", "FERRITIN", "TIBC", "IRON", "RETICCTPCT" in the last 72 hours. Urinalysis    Component Value Date/Time   COLORURINE YELLOW 09/05/2018 1534   APPEARANCEUR CLEAR 09/05/2018 1534    LABSPEC 1.007 09/05/2018 1534   PHURINE 5.5 09/05/2018 1534   GLUCOSEU NEGATIVE 09/05/2018 1534   HGBUR NEGATIVE 09/05/2018 1534   [place] 09/05/2018 1534   PROTEINUR NEGATIVE 09/05/2018 1534   NITRITE NEGATIVE 09/05/2018 [place] 09/05/2018 1534   Sepsis Labs Recent Labs  Lab 04/01/22 1256  WBC 5.5   Microbiology No results found for this or any previous visit (from the past 240 hour(s)).   Time coordinating discharge: 35 minutes  SIGNED:   Barb Merino, MD  [facility] 04/06/2022, 3:21 PM

## 2022-04-06 NOTE — Progress Notes (Signed)
Chaplain provided education on the Advanced Directive, Healthcare POA.  Chaplain answered questions and provided space for listening as Melanie Hogan and her family talked about future planning.  Chaplain offered support.     04/06/22 1400  Clinical Encounter Type  Visited With Patient and family together  Visit Type Follow-up;Social support

## 2022-04-06 NOTE — Progress Notes (Signed)
Patient being discharged to home with mom and brother. PIV removed per order. Discharge teaching completed and all questions answered. Patient sent home with all personal belongings and escorted to car in wheelchair.

## 2022-04-06 NOTE — Progress Notes (Signed)
Palliative Care Progress Note, Assessment & Plan   Patient Name: Melanie Hogan       Date: 04/06/2022 DOB: 1970/10/17  Age: 51 y.o. MRN#: [id number] Attending Physician: Barb Merino, MD Primary Care Physician: [name], DO Admit Date: 04/01/2022  Reason for Consultation/Follow-up: Establishing goals of care  Subjective: Patient is sitting up in bed in no apparent distress.  She acknowledges my presence and is able to make her wishes known.  PCT is at bedside helping patient brush her teeth.  Patient's mother is at bedside.  During our discussion, patient's brother Fritz Pickerel entered as well.    HPI: 51 y.o. female  with past medical history of ALS, anxiety/depression, melanoma on back status post resection, chronic thoracic vertebral compression fracture, presented with worsening of headaches and ambulation dysfunction. She recently shortness of breath along with progressive tremors and weakness.  Subsequently she also developed a global headache with occasional nausea.  CT of the head was ordered by neurology that showed multiple intracranial lesions and edema suspicious for metastatic lesions along with midline shift and then follow-up CT of the abdomen showed multiple metastatic lesions in the lungs as well as hilar nodes.  She was admitted on 04/01/2022 with metastatic brain lesions with mass effect and midline shift and recurrent melanoma with mets to brain and lung.   PMT was consulted for goals of care discussions.  Summary of counseling/coordination of care: After reviewing the patient's chart I introduced myself to the patient. She shared that her brother Fritz Pickerel "knows all of my wishes".  I outlined that we will not be reviewing her DNR or boundaries of care at this moment.  Rather, I would like  to discuss her symptom burden/management.  She shares she is not experiencing any pain.  She endorses difficulty sleeping since she is not in her zero gravity bed at home.  Brother Fritz Pickerel shares concerns of patient not having a bowel movement since last Thursday.  Discussed importance of bowel regimen to avoid constipation.  Also reviewed that in stressful situations patient's sphincters revert and oftentimes regular bowel movement patterns are disrupted.  Assessment of abdomen revealed it is soft, nontender, and not distended.  Patient denies feeling of constipation.  Encouraged use of stool softener daily.  Patient's brother is concerned about layering in personal care when patient is discharged to home.  He shares he has several resources available from friends/family to help take care of her.  We discussed ability for outpatient palliative services to follow patient.  While these are not personal care services, this will help keep "eyes in the sky" on patient's goals of care and symptom management.  Both patient and Fritz Pickerel were in agreement.  TOC referral for outpatient palliative services placed.  Also outlined that patient will be able to see my colleague Lexine Baton at the cancer center when she has radiation treatments.  Discussed Nikki's role as a palliative provider is also to provide relief from pain and suffering from patient's chronic illness.  Both patient and Fritz Pickerel were appreciative of this information as well.  We briefly discussed how patient is ready to be discharged home and looking forward to watching [place] and college football on Saturday.  This  brings her joy and a smile to her face.  Questions and concerns were addressed.  PMT will continue to follow the patient throughout her hospitalization.  PMT will shadow the chart and reengage at patient/family's request, if goals change, or if patient's health deteriorates.  Code Status: DNR  Prognosis: Unable to determine  Discharge  Planning: Home with Palliative Services  Physical Exam Vitals reviewed.  Constitutional:      General: She is not in acute distress.    Appearance: Normal appearance. She is not toxic-appearing.  HENT:     Head: Normocephalic.     Comments: Speech is slow but clear    Mouth/Throat:     Mouth: Mucous membranes are moist.  Eyes:     Pupils: Pupils are equal, round, and reactive to light.  Cardiovascular:     Pulses: Normal pulses.  Pulmonary:     Effort: Pulmonary effort is normal.  Abdominal:     General: There is no distension.     Palpations: Abdomen is soft. There is no mass.     Tenderness: There is no abdominal tenderness. There is no rebound.  Musculoskeletal:     Comments: Bilateral LE weakness  Skin:    General: Skin is warm and dry.  Neurological:     Mental Status: She is alert and oriented to person, place, and time.  Psychiatric:        Mood and Affect: Mood normal.        Behavior: Behavior normal.        Thought Content: Thought content normal.        Judgment: Judgment normal.             Palliative Assessment/Data: 30%    Total Time 50 minutes  Greater than 50%  of this time was spent counseling and coordinating care related to the above assessment and plan.  Thank you for allowing the Palliative Medicine Team to assist in the care of this patient.  [place]is Ilsa Iha, FNP-BC Palliative Medicine Team Team Phone # [phone]

## 2022-04-07 ENCOUNTER — Other Ambulatory Visit: Payer: Self-pay

## 2022-04-07 ENCOUNTER — Ambulatory Visit
Admission: RE | Admit: 2022-04-07 | Discharge: 2022-04-07 | Disposition: A | Discharge status: Home / Self Care | Payer: Medicare HMO | Source: Ambulatory Visit | Attending: Radiation Oncology | Admitting: Radiation Oncology

## 2022-04-07 ENCOUNTER — Telehealth: Payer: Self-pay | Admitting: *Deleted

## 2022-04-07 ENCOUNTER — Other Ambulatory Visit: Payer: Self-pay | Admitting: Nurse Practitioner

## 2022-04-07 ENCOUNTER — Encounter: Payer: Self-pay | Admitting: *Deleted

## 2022-04-07 DIAGNOSIS — C7931 Secondary malignant neoplasm of brain: Secondary | ICD-10-CM | POA: Diagnosis not present

## 2022-04-07 DIAGNOSIS — C439 Malignant melanoma of skin, unspecified: Secondary | ICD-10-CM | POA: Diagnosis not present

## 2022-04-07 DIAGNOSIS — Z510 Encounter for antineoplastic radiation therapy: Secondary | ICD-10-CM | POA: Diagnosis not present

## 2022-04-07 LAB — RAD ONC ARIA SESSION SUMMARY
Course Elapsed Days: 1
Plan Fractions Treated to Date: 2
Plan Prescribed Dose Per Fraction: 3 Gy
Plan Total Fractions Prescribed: 10
Plan Total Prescribed Dose: 30 Gy
Reference Point Dosage Given to Date: 6 Gy
Reference Point Session Dosage Given: 3 Gy
Session Number: 2

## 2022-04-07 NOTE — Patient Outreach (Signed)
Care Coordination [facility] Note Transition Care Management Follow-up Telephone Call Date of discharge and from where: 04/06/22 Melanie Hogan How have you been since you were released from the hospital? Per patient and caregiver/ mother [name],"I am doing okay, I want you to talk to my mother and I will listen;" per caregiver/ mother: "she is doing okay overall, she didn't sleep great last night because of the steroids they have prescribed, but she is comfortable and we are doing alright" Any questions or concerns? Yes - caregiver questions if patient can be prescribed a "urine collection system like she had in the hospital;" states this was not an indwelling catheter- confirmed patient has oncology specialist visit this afternoon- oncology handling patient's overall medical management-- encouraged caregiver to discuss this possibility of needed equipment being covered through patient's insurance and confirmed that this will likely require a physician order-- caregiver verbalizes plans to request this during scheduled specialist visit today  Items Reviewed: Did the pt receive and understand the discharge instructions provided? Yes  Medications obtained and verified? Yes  Other?  Caregiver confirms no home health services currently in place- family has been managing care of patient at home: discussed purposes, value, and disciplines of home health services that might be beneficial to them: explained these services require a doctor's order- caregiver will consider and discuss with oncology provider as/ if needed; confirmed community palliative care currently active  Any new allergies since your discharge? No  Dietary orders reviewed? Yes Do you have support at home? Yes  patient's mother, brother, and sister are assisting with all care needs; mother reports caregiver/ family are "managing okay so far"  Home Care and Equipment/Supplies: Were home health services ordered? no If so, what is the name of the  agency? N/A  Has the agency set up a time to come to the patient's home? not applicable Were any new equipment or medical supplies ordered?  No What is the name of the medical supply agency? N/A Were you able to get the supplies/equipment? not applicable Do you have any questions related to the use of the equipment or supplies? No  Functional Questionnaire: (I = Independent and D = Dependent) ADLs: D  patient's mother, brother, and sister are assisting with all care needs  Bathing/Dressing- D  patient's mother, brother, and sister are assisting with all care needs  Meal Prep- D  patient's mother, brother, and sister are assisting with all care needs  Eating- I  Maintaining continence- D  patient's mother, brother, and sister are assisting with all care needs  Transferring/Ambulation- D  patient's mother, brother, and sister are assisting with all care needs  Managing Meds- D  patient's mother, brother, and sister are assisting with all care needs  Follow up appointments reviewed:  PCP Hospital f/u appt confirmed? No  Scheduled to see - on - @ [facility] f/u appt confirmed? Yes  Scheduled to see oncology provider/ radiation specialist on today, 04/07/22 @ 1:00 pm Are transportation arrangements needed? No  If their condition worsens, is the pt aware to call PCP or go to the Emergency Dept.? Yes Was the patient provided with contact information for the PCP's office or ED? Yes Was to pt encouraged to call back with questions or concerns? Yes- provided my direct contact information should needs/ questions arise in the future  SDOH assessments and interventions completed:   Yes  Care Coordination Interventions Activated:  Yes   Care Coordination Interventions:   discussed/ provided education around need for  updated DPR, purpose of DPR; provided education around home health purpose/ disciplines, services should family need in future, along with process to obtain these services        Encounter Outcome:  Pt. Visit Completed    [name], RN, BSN, CCRN Alumnus RN CM Care Coordination/ Transition of [place] Management [phone]: direct office

## 2022-04-08 ENCOUNTER — Other Ambulatory Visit: Payer: Self-pay

## 2022-04-08 ENCOUNTER — Ambulatory Visit
Admission: RE | Admit: 2022-04-08 | Discharge: 2022-04-08 | Disposition: A | Discharge status: Home / Self Care | Payer: Medicare HMO | Source: Ambulatory Visit | Attending: Radiation Oncology | Admitting: Radiation Oncology

## 2022-04-08 DIAGNOSIS — C439 Malignant melanoma of skin, unspecified: Secondary | ICD-10-CM | POA: Insufficient documentation

## 2022-04-08 DIAGNOSIS — C7931 Secondary malignant neoplasm of brain: Secondary | ICD-10-CM | POA: Diagnosis not present

## 2022-04-08 DIAGNOSIS — Z510 Encounter for antineoplastic radiation therapy: Secondary | ICD-10-CM | POA: Diagnosis not present

## 2022-04-08 LAB — RAD ONC ARIA SESSION SUMMARY
Course Elapsed Days: 2
Plan Fractions Treated to Date: 3
Plan Prescribed Dose Per Fraction: 3 Gy
Plan Total Fractions Prescribed: 10
Plan Total Prescribed Dose: 30 Gy
Reference Point Dosage Given to Date: 9 Gy
Reference Point Session Dosage Given: 3 Gy
Session Number: 3

## 2022-04-12 ENCOUNTER — Ambulatory Visit
Admission: RE | Admit: 2022-04-12 | Discharge: 2022-04-12 | Disposition: A | Discharge status: Home / Self Care | Payer: Medicare HMO | Source: Ambulatory Visit | Attending: Radiation Oncology | Admitting: Radiation Oncology

## 2022-04-12 ENCOUNTER — Other Ambulatory Visit: Payer: Self-pay

## 2022-04-12 ENCOUNTER — Telehealth: Payer: Self-pay

## 2022-04-12 ENCOUNTER — Other Ambulatory Visit: Payer: Self-pay | Admitting: Urology

## 2022-04-12 ENCOUNTER — Ambulatory Visit: Payer: Medicare HMO

## 2022-04-12 DIAGNOSIS — C7931 Secondary malignant neoplasm of brain: Secondary | ICD-10-CM | POA: Diagnosis not present

## 2022-04-12 DIAGNOSIS — C439 Malignant melanoma of skin, unspecified: Secondary | ICD-10-CM | POA: Diagnosis not present

## 2022-04-12 DIAGNOSIS — Z510 Encounter for antineoplastic radiation therapy: Secondary | ICD-10-CM | POA: Diagnosis not present

## 2022-04-12 LAB — RAD ONC ARIA SESSION SUMMARY
Course Elapsed Days: 6
Plan Fractions Treated to Date: 4
Plan Prescribed Dose Per Fraction: 3 Gy
Plan Total Fractions Prescribed: 10
Plan Total Prescribed Dose: 30 Gy
Reference Point Dosage Given to Date: 12 Gy
Reference Point Session Dosage Given: 3 Gy
Session Number: 4

## 2022-04-12 MED ORDER — CLOTRIMAZOLE 10 MG MT TROC: 10.0000 mg | Freq: Every day | OROMUCOSAL | 1 refills | Status: DC

## 2022-04-12 NOTE — Telephone Encounter (Signed)
RN called patient brother Melanie Hogan after identity of patient confirmed gave him information on OTC medication for constipation, cough/congestion, and prescription for thrush per Ashlyn Bruning, PA-C to take care of symptoms.  And if no effects from OTC enema to reach out to her PCP to further address.  Both were appreciative and agreed.  Patient brother did request to talk with social worker about getting assist at home.  Will see if can add a SW referral to follow-up.

## 2022-04-13 ENCOUNTER — Other Ambulatory Visit: Payer: Self-pay

## 2022-04-13 ENCOUNTER — Ambulatory Visit
Admission: RE | Admit: 2022-04-13 | Discharge: 2022-04-13 | Disposition: A | Discharge status: Home / Self Care | Payer: Medicare HMO | Source: Ambulatory Visit | Attending: Radiation Oncology | Admitting: Radiation Oncology

## 2022-04-13 DIAGNOSIS — Z510 Encounter for antineoplastic radiation therapy: Secondary | ICD-10-CM | POA: Diagnosis not present

## 2022-04-13 DIAGNOSIS — G1221 Amyotrophic lateral sclerosis: Secondary | ICD-10-CM

## 2022-04-13 DIAGNOSIS — C7931 Secondary malignant neoplasm of brain: Secondary | ICD-10-CM | POA: Diagnosis not present

## 2022-04-13 DIAGNOSIS — C439 Malignant melanoma of skin, unspecified: Secondary | ICD-10-CM | POA: Diagnosis not present

## 2022-04-13 LAB — RAD ONC ARIA SESSION SUMMARY
Course Elapsed Days: 7
Plan Fractions Treated to Date: 5
Plan Prescribed Dose Per Fraction: 3 Gy
Plan Total Fractions Prescribed: 10
Plan Total Prescribed Dose: 30 Gy
Reference Point Dosage Given to Date: 15 Gy
Reference Point Session Dosage Given: 3 Gy
Session Number: 5

## 2022-04-13 NOTE — Progress Notes (Signed)
Patient and brother requested services and Freeman Caldron, PA-C approved.

## 2022-04-14 ENCOUNTER — Other Ambulatory Visit: Payer: Self-pay

## 2022-04-14 ENCOUNTER — Telehealth: Payer: Self-pay | Admitting: *Deleted

## 2022-04-14 ENCOUNTER — Inpatient Hospital Stay: Payer: Medicare HMO | Admitting: Nurse Practitioner

## 2022-04-14 ENCOUNTER — Encounter: Payer: Self-pay | Admitting: *Deleted

## 2022-04-14 ENCOUNTER — Ambulatory Visit
Admission: RE | Admit: 2022-04-14 | Discharge: 2022-04-14 | Disposition: A | Discharge status: Home / Self Care | Payer: Medicare HMO | Source: Ambulatory Visit | Attending: Radiation Oncology | Admitting: Radiation Oncology

## 2022-04-14 ENCOUNTER — Telehealth: Payer: Self-pay

## 2022-04-14 DIAGNOSIS — C439 Malignant melanoma of skin, unspecified: Secondary | ICD-10-CM | POA: Diagnosis not present

## 2022-04-14 DIAGNOSIS — Z510 Encounter for antineoplastic radiation therapy: Secondary | ICD-10-CM | POA: Diagnosis not present

## 2022-04-14 DIAGNOSIS — C7931 Secondary malignant neoplasm of brain: Secondary | ICD-10-CM | POA: Diagnosis not present

## 2022-04-14 LAB — RAD ONC ARIA SESSION SUMMARY
Course Elapsed Days: 8
Plan Fractions Treated to Date: 6
Plan Prescribed Dose Per Fraction: 3 Gy
Plan Total Fractions Prescribed: 10
Plan Total Prescribed Dose: 30 Gy
Reference Point Dosage Given to Date: 18 Gy
Reference Point Session Dosage Given: 3 Gy
Session Number: 6

## 2022-04-14 NOTE — Patient Outreach (Signed)
  Care Coordination   04/14/2022 Name: Melanie Hogan MRN: [id number] DOB: 07-31-1971   Care Coordination Outreach Attempts:  An unsuccessful telephone outreach was attempted today to offer the patient information about available care coordination services as a benefit of their health plan.   Follow Up Plan:  No further outreach attempts will be made at this time. We have been unable to contact the patient to offer or enroll patient in care coordination services- has spoken with [name] for TOC follow up on 04/07/22; received voice message from patient's mother today, stating they would like to obtain a handicapped toilet; attempted calls to both patient and her mother, explained in voice mail to request this equipment from her care providers; noted that patient has visit today with palliative care provider-- will make provider aware of this request; re- provided my contact information in the message I left for patient and her mother should needs arise in the future  Encounter Outcome:  No Answer  Care Coordination Interventions Activated:  Yes   Care Coordination Interventions:  Yes, provided    [name], RN, BSN, CCRN Alumnus RN CM Care Coordination/ Transition of [place] Management [phone]: direct office

## 2022-04-14 NOTE — Telephone Encounter (Signed)
Pt missed appt, attempted to call pt no answer, LVM and callback number.

## 2022-04-15 ENCOUNTER — Other Ambulatory Visit: Payer: Self-pay

## 2022-04-15 ENCOUNTER — Ambulatory Visit
Admission: RE | Admit: 2022-04-15 | Discharge: 2022-04-15 | Disposition: A | Discharge status: Home / Self Care | Payer: Medicare HMO | Source: Ambulatory Visit | Attending: Radiation Oncology | Admitting: Radiation Oncology

## 2022-04-15 DIAGNOSIS — Z510 Encounter for antineoplastic radiation therapy: Secondary | ICD-10-CM | POA: Diagnosis not present

## 2022-04-15 DIAGNOSIS — C439 Malignant melanoma of skin, unspecified: Secondary | ICD-10-CM | POA: Diagnosis not present

## 2022-04-15 DIAGNOSIS — C7931 Secondary malignant neoplasm of brain: Secondary | ICD-10-CM | POA: Diagnosis not present

## 2022-04-15 DIAGNOSIS — G1221 Amyotrophic lateral sclerosis: Secondary | ICD-10-CM | POA: Diagnosis not present

## 2022-04-15 LAB — RAD ONC ARIA SESSION SUMMARY
Course Elapsed Days: 9
Plan Fractions Treated to Date: 7
Plan Prescribed Dose Per Fraction: 3 Gy
Plan Total Fractions Prescribed: 10
Plan Total Prescribed Dose: 30 Gy
Reference Point Dosage Given to Date: 21 Gy
Reference Point Session Dosage Given: 3 Gy
Session Number: 7

## 2022-04-18 ENCOUNTER — Other Ambulatory Visit: Payer: Self-pay

## 2022-04-18 ENCOUNTER — Ambulatory Visit
Admission: RE | Admit: 2022-04-18 | Discharge: 2022-04-18 | Disposition: A | Discharge status: Home / Self Care | Payer: Medicare HMO | Source: Ambulatory Visit | Attending: Radiation Oncology | Admitting: Radiation Oncology

## 2022-04-18 DIAGNOSIS — C439 Malignant melanoma of skin, unspecified: Secondary | ICD-10-CM | POA: Diagnosis not present

## 2022-04-18 DIAGNOSIS — C7931 Secondary malignant neoplasm of brain: Secondary | ICD-10-CM | POA: Diagnosis not present

## 2022-04-18 DIAGNOSIS — Z510 Encounter for antineoplastic radiation therapy: Secondary | ICD-10-CM | POA: Diagnosis not present

## 2022-04-18 LAB — RAD ONC ARIA SESSION SUMMARY
Course Elapsed Days: 12
Plan Fractions Treated to Date: 8
Plan Prescribed Dose Per Fraction: 3 Gy
Plan Total Fractions Prescribed: 10
Plan Total Prescribed Dose: 30 Gy
Reference Point Dosage Given to Date: 24 Gy
Reference Point Session Dosage Given: 3 Gy
Session Number: 8

## 2022-04-19 ENCOUNTER — Ambulatory Visit
Admission: RE | Admit: 2022-04-19 | Discharge: 2022-04-19 | Disposition: A | Discharge status: Home / Self Care | Payer: Medicare HMO | Source: Ambulatory Visit | Attending: Radiation Oncology | Admitting: Radiation Oncology

## 2022-04-19 ENCOUNTER — Other Ambulatory Visit: Payer: Self-pay

## 2022-04-19 DIAGNOSIS — C439 Malignant melanoma of skin, unspecified: Secondary | ICD-10-CM | POA: Diagnosis not present

## 2022-04-19 DIAGNOSIS — Z510 Encounter for antineoplastic radiation therapy: Secondary | ICD-10-CM | POA: Diagnosis not present

## 2022-04-19 DIAGNOSIS — C7931 Secondary malignant neoplasm of brain: Secondary | ICD-10-CM | POA: Diagnosis not present

## 2022-04-19 LAB — RAD ONC ARIA SESSION SUMMARY
Course Elapsed Days: 13
Plan Fractions Treated to Date: 9
Plan Prescribed Dose Per Fraction: 3 Gy
Plan Total Fractions Prescribed: 10
Plan Total Prescribed Dose: 30 Gy
Reference Point Dosage Given to Date: 27 Gy
Reference Point Session Dosage Given: 3 Gy
Session Number: 9

## 2022-04-20 ENCOUNTER — Encounter: Payer: Self-pay | Admitting: Urology

## 2022-04-20 ENCOUNTER — Ambulatory Visit
Admission: RE | Admit: 2022-04-20 | Discharge: 2022-04-20 | Disposition: A | Discharge status: Home / Self Care | Payer: Medicare HMO | Source: Ambulatory Visit | Attending: Radiation Oncology | Admitting: Radiation Oncology

## 2022-04-20 ENCOUNTER — Other Ambulatory Visit: Payer: Self-pay

## 2022-04-20 DIAGNOSIS — C7931 Secondary malignant neoplasm of brain: Secondary | ICD-10-CM

## 2022-04-20 DIAGNOSIS — C439 Malignant melanoma of skin, unspecified: Secondary | ICD-10-CM | POA: Diagnosis not present

## 2022-04-20 DIAGNOSIS — Z510 Encounter for antineoplastic radiation therapy: Secondary | ICD-10-CM | POA: Diagnosis not present

## 2022-04-20 LAB — RAD ONC ARIA SESSION SUMMARY
Course Elapsed Days: 14
Plan Fractions Treated to Date: 10
Plan Prescribed Dose Per Fraction: 3 Gy
Plan Total Fractions Prescribed: 10
Plan Total Prescribed Dose: 30 Gy
Reference Point Dosage Given to Date: 30 Gy
Reference Point Session Dosage Given: 3 Gy
Session Number: 10

## 2022-04-22 DIAGNOSIS — G1221 Amyotrophic lateral sclerosis: Secondary | ICD-10-CM | POA: Diagnosis not present

## 2022-04-22 DIAGNOSIS — Z7409 Other reduced mobility: Secondary | ICD-10-CM | POA: Diagnosis not present

## 2022-04-22 DIAGNOSIS — Z79891 Long term (current) use of opiate analgesic: Secondary | ICD-10-CM | POA: Diagnosis not present

## 2022-04-22 DIAGNOSIS — G959 Disease of spinal cord, unspecified: Secondary | ICD-10-CM | POA: Diagnosis not present

## 2022-04-28 ENCOUNTER — Inpatient Hospital Stay: Payer: Medicare HMO | Admitting: Nurse Practitioner

## 2022-04-28 ENCOUNTER — Telehealth: Payer: Self-pay

## 2022-04-28 NOTE — Telephone Encounter (Signed)
Attempted to call pt to cancel appt today, no answer, unable to LVM d/t box being full. Sent a high priority scheduling message as well as a mychart message to pt.

## 2022-05-03 ENCOUNTER — Other Ambulatory Visit: Payer: Self-pay

## 2022-05-03 MED ORDER — ONDANSETRON HCL 8 MG PO TABS: 8.0000 mg | ORAL_TABLET | Freq: Three times a day (TID) | ORAL | 0 refills | Status: DC | PRN

## 2022-05-03 NOTE — Progress Notes (Signed)
Spoke with pt mother regarding n/v, zofran sent to preferred pharmacy, pt family educated on how to take, no further questions or needs.

## 2022-05-05 ENCOUNTER — Emergency Department (HOSPITAL_COMMUNITY)
Admission: RE | Admit: 2022-05-05 | Discharge: 2022-05-05 | Disposition: A | Discharge status: Home / Self Care | Payer: Medicare HMO | Source: Ambulatory Visit | Attending: Radiation Oncology | Admitting: Radiation Oncology

## 2022-05-05 ENCOUNTER — Emergency Department (HOSPITAL_COMMUNITY)
Admission: EM | Admit: 2022-05-05 | Discharge: 2022-05-06 | Disposition: A | Discharge status: Home / Self Care | Payer: Medicare HMO | Attending: Emergency Medicine | Admitting: Emergency Medicine

## 2022-05-05 ENCOUNTER — Telehealth: Payer: Self-pay

## 2022-05-05 ENCOUNTER — Other Ambulatory Visit: Payer: Self-pay | Admitting: Urology

## 2022-05-05 ENCOUNTER — Encounter (HOSPITAL_COMMUNITY): Payer: Self-pay

## 2022-05-05 ENCOUNTER — Other Ambulatory Visit: Payer: Self-pay | Admitting: Radiation Therapy

## 2022-05-05 DIAGNOSIS — Z923 Personal history of irradiation: Secondary | ICD-10-CM | POA: Insufficient documentation

## 2022-05-05 DIAGNOSIS — R059 Cough, unspecified: Secondary | ICD-10-CM | POA: Insufficient documentation

## 2022-05-05 DIAGNOSIS — R1030 Lower abdominal pain, unspecified: Secondary | ICD-10-CM | POA: Diagnosis not present

## 2022-05-05 DIAGNOSIS — C439 Malignant melanoma of skin, unspecified: Secondary | ICD-10-CM | POA: Diagnosis not present

## 2022-05-05 DIAGNOSIS — C7931 Secondary malignant neoplasm of brain: Secondary | ICD-10-CM

## 2022-05-05 DIAGNOSIS — I700 Atherosclerosis of aorta: Secondary | ICD-10-CM | POA: Diagnosis not present

## 2022-05-05 DIAGNOSIS — Z85841 Personal history of malignant neoplasm of brain: Secondary | ICD-10-CM | POA: Insufficient documentation

## 2022-05-05 DIAGNOSIS — R1084 Generalized abdominal pain: Secondary | ICD-10-CM | POA: Diagnosis not present

## 2022-05-05 DIAGNOSIS — R519 Headache, unspecified: Secondary | ICD-10-CM | POA: Diagnosis not present

## 2022-05-05 DIAGNOSIS — K529 Noninfective gastroenteritis and colitis, unspecified: Secondary | ICD-10-CM | POA: Diagnosis not present

## 2022-05-05 DIAGNOSIS — G936 Cerebral edema: Secondary | ICD-10-CM | POA: Diagnosis not present

## 2022-05-05 DIAGNOSIS — R112 Nausea with vomiting, unspecified: Secondary | ICD-10-CM | POA: Diagnosis not present

## 2022-05-05 DIAGNOSIS — K5900 Constipation, unspecified: Secondary | ICD-10-CM | POA: Insufficient documentation

## 2022-05-05 DIAGNOSIS — G9389 Other specified disorders of brain: Secondary | ICD-10-CM | POA: Diagnosis not present

## 2022-05-05 DIAGNOSIS — Z85820 Personal history of malignant melanoma of skin: Secondary | ICD-10-CM | POA: Diagnosis not present

## 2022-05-05 DIAGNOSIS — G1221 Amyotrophic lateral sclerosis: Secondary | ICD-10-CM | POA: Diagnosis not present

## 2022-05-05 DIAGNOSIS — Z743 Need for continuous supervision: Secondary | ICD-10-CM | POA: Diagnosis not present

## 2022-05-05 DIAGNOSIS — R109 Unspecified abdominal pain: Secondary | ICD-10-CM | POA: Diagnosis not present

## 2022-05-05 MED ORDER — DEXAMETHASONE 2 MG PO TABS: 2.0000 mg | ORAL_TABLET | Freq: Two times a day (BID) | ORAL | 0 refills | Status: DC

## 2022-05-05 MED ORDER — IOHEXOL 300 MG/ML  SOLN
80.0000 mL | Freq: Once | INTRAMUSCULAR | Status: AC | PRN
  Administered 2022-05-05: 80 mL via INTRAVENOUS

## 2022-05-05 NOTE — ED Triage Notes (Signed)
Pt from home BIB [facility] for n/v/abd pain x1 hr, brain cancer pt & ALS (answer yes or no only). Radiation no chemo. Oxycodon prior to EMS arrival pain 5/10 to 2/10. Pt alert, VSS.

## 2022-05-05 NOTE — Telephone Encounter (Signed)
Ms. Wynia brother Isaias Cowman called about symptoms of migraines having he was made aware that Ms. Warwick will be started on steroids and CT scan scheduled for this morning at [facility] per Dr. Tammi Klippel.  Verbalized understanding and will make sure she gets her there.  Nothing else follows

## 2022-05-06 ENCOUNTER — Emergency Department (HOSPITAL_COMMUNITY): Payer: Medicare HMO

## 2022-05-06 ENCOUNTER — Telehealth: Payer: Self-pay | Admitting: General Practice

## 2022-05-06 ENCOUNTER — Encounter (HOSPITAL_COMMUNITY): Payer: Self-pay

## 2022-05-06 ENCOUNTER — Other Ambulatory Visit: Payer: Self-pay

## 2022-05-06 ENCOUNTER — Telehealth: Payer: Self-pay | Admitting: Radiation Therapy

## 2022-05-06 DIAGNOSIS — Z7401 Bed confinement status: Secondary | ICD-10-CM | POA: Diagnosis not present

## 2022-05-06 DIAGNOSIS — I700 Atherosclerosis of aorta: Secondary | ICD-10-CM | POA: Diagnosis not present

## 2022-05-06 DIAGNOSIS — Z743 Need for continuous supervision: Secondary | ICD-10-CM | POA: Diagnosis not present

## 2022-05-06 DIAGNOSIS — R059 Cough, unspecified: Secondary | ICD-10-CM | POA: Diagnosis not present

## 2022-05-06 DIAGNOSIS — I959 Hypotension, unspecified: Secondary | ICD-10-CM | POA: Diagnosis not present

## 2022-05-06 DIAGNOSIS — R109 Unspecified abdominal pain: Secondary | ICD-10-CM | POA: Diagnosis not present

## 2022-05-06 LAB — CBC WITH DIFFERENTIAL/PLATELET
Abs Immature Granulocytes: 0.06 10*3/uL (ref 0.00–0.07)
Basophils Absolute: 0 10*3/uL (ref 0.0–0.1)
Basophils Relative: 0 %
Eosinophils Absolute: 0.1 10*3/uL (ref 0.0–0.5)
Eosinophils Relative: 1 %
HCT: 33.7 % — ABNORMAL LOW (ref 36.0–46.0)
Hemoglobin: 11.1 g/dL — ABNORMAL LOW (ref 12.0–15.0)
Immature Granulocytes: 1 %
Lymphocytes Relative: 12 %
Lymphs Abs: 1.1 10*3/uL (ref 0.7–4.0)
MCH: 31.1 pg (ref 26.0–34.0)
MCHC: 32.9 g/dL (ref 30.0–36.0)
MCV: 94.4 fL (ref 80.0–100.0)
Monocytes Absolute: 1 10*3/uL (ref 0.1–1.0)
Monocytes Relative: 11 %
Neutro Abs: 6.7 10*3/uL (ref 1.7–7.7)
Neutrophils Relative %: 75 %
Platelets: 316 10*3/uL (ref 150–400)
RBC: 3.57 MIL/uL — ABNORMAL LOW (ref 3.87–5.11)
RDW: 14 % (ref 11.5–15.5)
WBC: 9 10*3/uL (ref 4.0–10.5)
nRBC: 0 % (ref 0.0–0.2)

## 2022-05-06 LAB — URINALYSIS, ROUTINE W REFLEX MICROSCOPIC
Bilirubin Urine: NEGATIVE
Glucose, UA: NEGATIVE mg/dL
Hgb urine dipstick: NEGATIVE
Ketones, ur: NEGATIVE mg/dL
Leukocytes,Ua: NEGATIVE
Nitrite: NEGATIVE
Protein, ur: NEGATIVE mg/dL
Specific Gravity, Urine: >1.046 — ABNORMAL HIGH (ref 1.005–1.030)
pH: 6 (ref 5.0–8.0)

## 2022-05-06 LAB — POCT I-STAT, CHEM 8
BUN: 18 mg/dL (ref 6–20)
Calcium, Ion: 1.16 mmol/L (ref 1.15–1.40)
Chloride: 105 mmol/L (ref 98–111)
Creatinine, Ser: 0.7 mg/dL (ref 0.44–1.00)
Glucose, Bld: 94 mg/dL (ref 70–99)
HCT: 33 % — ABNORMAL LOW (ref 36.0–46.0)
Hemoglobin: 11.2 g/dL — ABNORMAL LOW (ref 12.0–15.0)
Potassium: 3.8 mmol/L (ref 3.5–5.1)
Sodium: 142 mmol/L (ref 135–145)
TCO2: 27 mmol/L (ref 22–32)

## 2022-05-06 LAB — LIPASE, BLOOD: Lipase: 35 U/L (ref 11–51)

## 2022-05-06 LAB — LACTIC ACID, PLASMA
Lactic Acid, Venous: 0.8 mmol/L (ref 0.5–1.9)
Lactic Acid, Venous: 1.2 mmol/L (ref 0.5–1.9)

## 2022-05-06 LAB — COMPREHENSIVE METABOLIC PANEL
ALT: 16 U/L (ref 0–44)
AST: 15 U/L (ref 15–41)
Albumin: 3.1 g/dL — ABNORMAL LOW (ref 3.5–5.0)
Alkaline Phosphatase: 76 U/L (ref 38–126)
Anion gap: 6 (ref 5–15)
BUN: 20 mg/dL (ref 6–20)
CO2: 27 mmol/L (ref 22–32)
Calcium: 8.8 mg/dL — ABNORMAL LOW (ref 8.9–10.3)
Chloride: 109 mmol/L (ref 98–111)
Creatinine, Ser: 0.8 mg/dL (ref 0.44–1.00)
GFR, Estimated: >60 mL/min (ref >60)
Glucose, Bld: 97 mg/dL (ref 70–99)
Potassium: 3.8 mmol/L (ref 3.5–5.1)
Sodium: 142 mmol/L (ref 135–145)
Total Bilirubin: 0.4 mg/dL (ref 0.3–1.2)
Total Protein: 6.1 g/dL — ABNORMAL LOW (ref 6.5–8.1)

## 2022-05-06 MED ORDER — PROCHLORPERAZINE EDISYLATE 10 MG/2ML IJ SOLN
10.0000 mg | Freq: Once | INTRAMUSCULAR | Status: AC
  Administered 2022-05-06: 10 mg via INTRAVENOUS
  Filled 2022-05-06: qty 2

## 2022-05-06 MED ORDER — SODIUM CHLORIDE 0.9 % IV BOLUS
1000.0000 mL | Freq: Once | INTRAVENOUS | Status: AC
  Administered 2022-05-06: 1000 mL via INTRAVENOUS

## 2022-05-06 MED ORDER — SODIUM CHLORIDE 0.9 % IV SOLN: INTRAVENOUS | Status: DC

## 2022-05-06 MED ORDER — IOHEXOL 300 MG/ML  SOLN
80.0000 mL | Freq: Once | INTRAMUSCULAR | Status: AC | PRN
  Administered 2022-05-06: 80 mL via INTRAVENOUS

## 2022-05-06 MED ORDER — ONDANSETRON 4 MG PO TBDP: 4.0000 mg | ORAL_TABLET | Freq: Three times a day (TID) | ORAL | 0 refills | Status: AC | PRN

## 2022-05-06 MED ORDER — SODIUM CHLORIDE (PF) 0.9 % IJ SOLN
INTRAMUSCULAR | Status: AC
  Filled 2022-05-06: qty 50

## 2022-05-06 NOTE — Telephone Encounter (Signed)
Transition Care Management Follow-up Telephone Call Date of discharge and from where: 05/06/22 from [facility] How have you been since you were released from the hospital? Patient's brother, Fritz Pickerel, stated patient is doing better since discharge. She was not able to come to the phone. They would like to schedule a virtual hospital follow up.  Any questions or concerns? No  Items Reviewed: Did the pt receive and understand the discharge instructions provided? Yes  Medications obtained and verified? No  Other? No  Any new allergies since your discharge? No  Dietary orders reviewed? Yes Do you have support at home? Yes   Home Care and Equipment/Supplies: Were home health services ordered? no  Functional Questionnaire: (I = Independent and D = Dependent) ADLs: D  Bathing/Dressing- D  Meal Prep- D  Eating- D  Maintaining continence- D  Transferring/Ambulation- D  Managing Meds- D  Follow up appointments reviewed:  PCP Hospital f/u appt confirmed? Yes  Scheduled to see Dr. Zigmund Daniel on 05/09/22 @ 0930. [facility] f/u appt confirmed? Yes  Scheduled to see oncologist on 06/08/22. Are transportation arrangements needed? No  If their condition worsens, is the pt aware to call PCP or go to the Emergency Dept.? Yes Was the patient provided with contact information for the PCP's office or ED? Yes Was to pt encouraged to call back with questions or concerns? Yes

## 2022-05-06 NOTE — Telephone Encounter (Signed)
I first attempted to call the patient, but was unable to reach her or leave a message on her cell. I called her Brother, Fritz Pickerel, to share the results of the recent head CT. The treated lesions are smaller and there were no worrisome findings. The hope is that her headache and nausea will improve now that she has restarted steroids. Dr. Tammi Klippel has instructed that she take '2mg'$  BID for two weeks, then '2mg'$  daily for two weeks and then stop. I have encouraged Fritz Pickerel to call back if her symptoms do not improve or if they return after completing the taper.   Fritz Pickerel shared that Ms. Esther was taken to the ED last night with nausea, vomiting and dehydration. He was thankful for the good news from the scan and hopeful that the steroids will help his sister feel better.   Mont Dutton R.T.(R)(T) Radiation Special Procedures Navigator

## 2022-05-06 NOTE — ED Notes (Signed)
Pt reports feeling cold. Pt provided with extra blankets

## 2022-05-06 NOTE — ED Notes (Signed)
I attempted a in and out cath no urine released.

## 2022-05-06 NOTE — ED Provider Notes (Signed)
[facility] DEPT Provider Note  CSN: [id number] Arrival date & time: 05/05/22 2343  Chief Complaint(s) Abdominal Pain, Nausea, and Emesis  HPI Melanie Hogan is a 51 y.o. female with a past medical history listed below including ALS, H/o Melanoma with brain mets s/p radiation. CT yesterday showed improved size of lesions   Emesis Severity:  Moderate Duration:  1 day Timing:  Intermittent Quality:  Stomach contents Progression:  Unchanged Chronicity:  New Relieved by:  Nothing Worsened by:  Nothing Associated symptoms: abdominal pain, cough and headaches   Associated symptoms: no diarrhea, no fever and no URI      Past Medical History Past Medical History:  Diagnosis Date   ALS (amyotrophic lateral sclerosis) (HCC)    Anxiety    At high risk for falls    Depression    Migraines    Tobacco use disorder    Patient Active Problem List   Diagnosis Date Noted   Melanoma metastatic to brain ([place]) 04/01/2022   Impaired ambulation 04/01/2022   ALS (amyotrophic lateral sclerosis) ([place]) - following w/ neurology  03/31/2021   Collapsed vertebra, not elsewhere classified, thoracic region, initial encounter for fracture ([place]) 11/24/2020   Spinal headache 06/24/2020   Malignant melanoma of torso excluding breast ([place]) 02/14/2020   Left leg weakness 12/11/2019   Benign cyst of right breast 10/12/2018   GAD (generalized anxiety disorder) 09/05/2018   Moderate episode of recurrent major depressive disorder ([place]) 09/05/2018   Tobacco use disorder 09/05/2018   Migraine without aura and without status migrainosus, not intractable 09/05/2018   Renal insufficiency 09/05/2018   S/p nephrectomy 09/28/2016   Donor of kidney for transplant 08/29/2016   Home Medication(s) Prior to Admission medications   Medication Sig Start Date End Date Taking? Authorizing Provider  baclofen (LIORESAL) 10 MG tablet Take 10 mg by mouth 3 (three) times daily. 01/31/21   Yes [provider]  clotrimazole (MYCELEX) 10 MG troche Take 1 tablet (10 mg total) by mouth 5 (five) times daily. 04/12/22  Yes Bruning, Ashlyn, PA-C  diazepam (VALIUM) 5 MG tablet Take 5 mg by mouth 3 (three) times daily as needed for anxiety. 03/10/21  Yes [provider]  escitalopram (LEXAPRO) 20 MG tablet Take 1 tablet (20 mg total) by mouth at bedtime. 09/06/21  Yes Breeback, Jade L, PA-C  levETIRAcetam (KEPPRA) 500 MG tablet Take 1 tablet (500 mg total) by mouth 2 (two) times daily. 04/06/22 07/05/22 Yes Barb Merino, MD  ondansetron (ZOFRAN) 8 MG tablet Take 1 tablet (8 mg total) by mouth every 8 (eight) hours as needed for nausea or vomiting. 05/03/22  Yes Pickenpack-Cousar, Carlena Sax, NP  ondansetron (ZOFRAN-ODT) 4 MG disintegrating tablet Take 1 tablet (4 mg total) by mouth every 8 (eight) hours as needed for up to 3 days for nausea or vomiting. 05/06/22 05/09/22 Yes Alleyah Twombly, Grayce Sessions, MD  oxyCODONE (OXY IR/ROXICODONE) 5 MG immediate release tablet Take 5 mg by mouth every 6 (six) hours as needed for severe pain.   Yes [provider]  pantoprazole (PROTONIX) 20 MG tablet Take 1 tablet (20 mg total) by mouth daily. 04/07/22 05/07/22 Yes Ghimire, Dante Gang, MD  polyethylene glycol (MIRALAX) 17 g packet Take 17 g by mouth 2 (two) times daily. 04/06/22  Yes Barb Merino, MD  senna (SENOKOT) 8.6 MG TABS tablet Take 1 tablet (8.6 mg total) by mouth daily. 04/06/22  Yes Barb Merino, MD  traZODone (DESYREL) 150 MG tablet Take 1 tablet (150 mg total)  by mouth at bedtime as needed. for sleep 09/06/21  Yes Breeback, Jade L, PA-C  dexamethasone (DECADRON) 2 MG tablet Take 1 tablet (2 mg total) by mouth 2 (two) times daily with a meal. Take 2 tablets twice daily for 2 days and then decrease to 1 tablet twice daily for 2 weeks and then 1 tablet daily for 1 week and then discontinue 05/05/22   Bruning, Ashlyn, PA-C  dexamethasone (DECADRON) 4 MG tablet 1 tab QID for 3 days 1 tab TID  for 3 days 1 tab BID for 3 days 1 tab Daily to continue Patient not taking: Reported on 05/06/2022 04/06/22   Barb Merino, MD                                                                                                                                    Allergies Morphine and Riluzole  Review of Systems Review of Systems  Constitutional:  Negative for fever.  Respiratory:  Positive for cough.   Gastrointestinal:  Positive for abdominal pain. Negative for diarrhea.  Neurological:  Positive for headaches.   As noted in HPI  Physical Exam Vital Signs  I have reviewed the triage vital signs BP 101/62   Pulse (!) 54   Temp 98.3 F (36.8 C) (Oral)   Resp 11   SpO2 96%   Physical Exam Vitals reviewed.  Constitutional:      General: She is not in acute distress.    Appearance: She is well-developed. She is not diaphoretic.  HENT:     Head: Normocephalic and atraumatic.     Nose: Nose normal.  Eyes:     General: No scleral icterus.       Right eye: No discharge.        Left eye: No discharge.     Conjunctiva/sclera: Conjunctivae normal.     Pupils: Pupils are equal, round, and reactive to light.  Cardiovascular:     Rate and Rhythm: Normal rate and regular rhythm.     Heart sounds: No murmur heard.    No friction rub. No gallop.  Pulmonary:     Effort: Pulmonary effort is normal. No respiratory distress.     Breath sounds: Normal breath sounds. No stridor. No rales.  Abdominal:     General: There is no distension.     Palpations: Abdomen is soft.     Tenderness: There is abdominal tenderness in the right upper quadrant, right lower quadrant, epigastric area, periumbilical area and left upper quadrant.  Musculoskeletal:        General: No tenderness.     Cervical back: Normal range of motion and neck supple.  Skin:    General: Skin is warm and dry.     Findings: No erythema or rash.  Neurological:     Mental Status: She is alert.     Comments: Answers yes or no  only     ED  Results and Treatments Labs (all labs ordered are listed, but only abnormal results are displayed) Labs Reviewed  COMPREHENSIVE METABOLIC PANEL - Abnormal; Notable for the following components:      Result Value   Calcium 8.8 (*)    Total Protein 6.1 (*)    Albumin 3.1 (*)    All other components within normal limits  CBC WITH DIFFERENTIAL/PLATELET - Abnormal; Notable for the following components:   RBC 3.57 (*)    Hemoglobin 11.1 (*)    HCT 33.7 (*)    All other components within normal limits  URINALYSIS, ROUTINE W REFLEX MICROSCOPIC - Abnormal; Notable for the following components:   Specific Gravity, Urine >1.046 (*)    All other components within normal limits  LIPASE, BLOOD  LACTIC ACID, PLASMA  LACTIC ACID, PLASMA  I-STAT CHEM 8, ED                                                                                                                         EKG  EKG Interpretation  Date/Time:    Ventricular Rate:    PR Interval:    QRS Duration:   QT Interval:    QTC Calculation:   R Axis:     Text Interpretation:         Radiology CT ABDOMEN PELVIS W CONTRAST  Result Date: 05/06/2022 CLINICAL DATA:  Acute nonlocalized abdominal pain; history of nephrectomy, hysterectomy, tubal ligation EXAM: CT ABDOMEN AND PELVIS WITH CONTRAST TECHNIQUE: Multidetector CT imaging of the abdomen and pelvis was performed using the standard protocol following bolus administration of intravenous contrast. RADIATION DOSE REDUCTION: This exam was performed according to the departmental dose-optimization program which includes automated exposure control, adjustment of the mA and/or kV according to patient size and/or use of iterative reconstruction technique. CONTRAST:  28m OMNIPAQUE IOHEXOL 300 MG/ML  SOLN COMPARISON:  CT abdomen and pelvis 07/15/2016 FINDINGS: Lower chest: Bibasilar atelectasis/scarring.  No acute abnormality. Hepatobiliary: No suspicious liver lesion. Vicarious  excretion of contrast in the gallbladder. No biliary ductal dilation. Pancreas: Unremarkable. No pancreatic ductal dilatation or surrounding inflammatory changes. Spleen: Normal in size without focal abnormality. Adrenals/Urinary Tract: Unremarkable adrenal glands. Left nephrectomy. No obstructing urinary calculi or hydronephrosis. Contrast from earlier CT from 05/05/2022 within the right renal collecting system, ureter, and bladder. Stomach/Bowel: Unremarkable stomach. Question mild wall thickening in loops of jejunum versus underdistention. No adjacent inflammatory stranding. Large colonic stool burden. Vascular/Lymphatic: Aortic atherosclerosis. No enlarged abdominal or pelvic lymph nodes. Reproductive: Status post hysterectomy. No adnexal masses. Other: No free intraperitoneal fluid or air. Musculoskeletal: Demineralization.  No acute osseous abnormality. IMPRESSION: Wall thickening in the jejunum may be due to underdistention versus mild enteritis. Large colonic stool burden.  Recommend correlation for constipation. Electronically Signed   By: TPlacido SouM.D.   On: 05/06/2022 02:13   DG Chest Portable 1 View  Result Date: 05/06/2022 CLINICAL DATA:  Cough EXAM: PORTABLE CHEST 1 VIEW COMPARISON:  None Available. FINDINGS: No focal  consolidation, pleural effusion, or pneumothorax. Normal cardiomediastinal silhouette. No acute osseous abnormality. IMPRESSION: No active disease. Electronically Signed   By: Placido Sou M.D.   On: 05/06/2022 01:02   CT HEAD W & WO CONTRAST (5MM)  Result Date: 05/05/2022 CLINICAL DATA:  Metastatic melanoma. Assess response to whole-brain radiation. Rule out hemorrhage. EXAM: CT HEAD WITHOUT AND WITH CONTRAST TECHNIQUE: Contiguous axial images were obtained from the base of the skull through the vertex without and with intravenous contrast. RADIATION DOSE REDUCTION: This exam was performed according to the departmental dose-optimization program which includes automated  exposure control, adjustment of the mA and/or kV according to patient size and/or use of iterative reconstruction technique. CONTRAST:  81m OMNIPAQUE IOHEXOL 300 MG/ML  SOLN COMPARISON:  MRI head with contrast 04/04/2022 FINDINGS: Brain: Multiple enhancing lesions are present in the brain compatible with metastatic disease. Overall the lesions appear slightly smaller compared with the prior MRI. Right temporal necrotic lesion axial image 10. This currently measures 25 mm, previously 27 mm. Adjacent right temporal lobe lesion now measures 10.7 mm, previously 16 mm. Moderate edema in the right temporal lobe slightly improved. Left medial parietal lesion now measures 15 mm previously 15 mm. Surrounding edema unchanged. High right parietal lesion currently measures 16 mm, previously 19 mm. Axial image 27 Multiple additional small lesions are present throughout both cerebral hemispheres most of which appears slightly smaller. Small lesions in the cerebellum previously are not identified by CT 4 mm midline shift to the left previously measured at 5 mm. No hydrocephalus. Thick wall of the largest lesion in the right temporal lobe is approximally isodense to brain. No definite hemorrhage in this lesion or other lesions. Vascular: Negative for hyperdense vessel. Normal vascular enhancement. Skull: Negative for metastatic disease Sinuses/Orbits: Mucosal edema right sphenoid sinus has developed since the prior study. Remaining sinuses clear. Negative orbit Other: None IMPRESSION: 1. Multiple enhancing metastatic lesions in the brain compatible with metastatic disease. Overall the lesions appear slightly smaller compared with the prior MRI. 2. 4 mm midline shift to the left previously measured at 5 mm. 3. No acute hemorrhage. 4. Mucosal edema right sphenoid sinus has developed since the prior study. Electronically Signed   By: CFranchot GalloM.D.   On: 05/05/2022 13:59    Medications Ordered in ED Medications  sodium  chloride 0.9 % bolus 1,000 mL (1,000 mLs Intravenous New Bag/Given 05/06/22 0224)    And  0.9 %  sodium chloride infusion ( Intravenous New Bag/Given 05/06/22 0224)  prochlorperazine (COMPAZINE) injection 10 mg (10 mg Intravenous Given 05/06/22 0223)  sodium chloride (PF) 0.9 % injection (  Given by Other 05/06/22 0230)  iohexol (OMNIPAQUE) 300 MG/ML solution 80 mL (80 mLs Intravenous Contrast Given 05/06/22 0146)  sodium chloride 0.9 % bolus 1,000 mL (1,000 mLs Intravenous New Bag/Given 05/06/22 0535)  Procedures Procedures  (including critical care time)  Medical Decision Making / ED Course   Medical Decision Making Amount and/or Complexity of Data Reviewed External Data Reviewed: radiology.    Details: CT mentioned in HPI Labs: ordered. Decision-making details documented in ED Course. Radiology: ordered and independent interpretation performed. Decision-making details documented in ED Course.  Risk Prescription drug management. Decision regarding hospitalization.    Patient presents with abdominal discomfort and emesis. Abdomen with mild periumbilical tenderness to palpation  We will need to assess for intra-abdominal inflammatory/infectious process or bowel obstruction. Given patient's cough, will also assess for pneumonia. Will assess for any electrolyte or metabolic derangements. No need to repeat CT head given recent imaging.  CBC without leukocytosis.  Stable hemoglobin Metabolic panel without significant electrolyte derangements or renal sufficiency.  No evidence of biliary obstruction or pancreatitis Without evidence of infection Chest x-ray without evidence of pneumonia, pneumothorax, pulmonary edema or pleural effusion. CT of the abdomen and pelvis notable for possible enteritis as well as constipation.  Otherwise reassuring.  Patient was  treated symptomatically with IV fluids and antiemetics. She was able to tolerate thickened liquids Felt well enough to be discharged home      Final Clinical Impression(s) / ED Diagnoses Final diagnoses:  Nausea and vomiting in adult  Enteritis  Constipation, unspecified constipation type   The patient appears reasonably screened and/or stabilized for discharge and I doubt any other medical condition or other [facility] requiring further screening, evaluation, or treatment in the ED at this time. I have discussed the findings, Dx and Tx plan with the patient/family who expressed understanding and agree(s) with the plan. Discharge instructions discussed at length. The patient/family was given strict return precautions who verbalized understanding of the instructions. No further questions at time of discharge.  Disposition: Discharge  Condition: Good  ED Discharge Orders          Ordered    ondansetron (ZOFRAN-ODT) 4 MG disintegrating tablet  Every 8 hours PRN        05/06/22 0634            Follow Up: [name], DO [address] [address] [phone]  Call  to schedule an appointment for close follow up           This chart was dictated using voice recognition software.  Despite best efforts to proofread,  errors can occur which can change the documentation meaning.    Fatima Blank, MD 05/06/22 [phone]

## 2022-05-06 NOTE — ED Notes (Signed)
PO trial Pt tolerated Apple sauce well. Pt denies nausea

## 2022-05-09 ENCOUNTER — Encounter: Payer: Self-pay | Admitting: Family Medicine

## 2022-05-09 ENCOUNTER — Telehealth (INDEPENDENT_AMBULATORY_CARE_PROVIDER_SITE_OTHER): Payer: Medicare HMO | Admitting: Family Medicine

## 2022-05-09 DIAGNOSIS — R112 Nausea with vomiting, unspecified: Secondary | ICD-10-CM

## 2022-05-09 NOTE — Assessment & Plan Note (Signed)
This has improved significantly.  Continue to increase fluids. Zofran as needed.  Contact clinic if having new/worsening symptoms.

## 2022-05-09 NOTE — Progress Notes (Signed)
No nausea. Switched diet. No illness since Friday morning.

## 2022-05-09 NOTE — Progress Notes (Signed)
Melanie Hogan - 51 y.o. female MRN [id number]  Date of birth: [birth year]   This visit type was conducted due to national recommendations for restrictions regarding the COVID-19 Pandemic (e.g. social distancing).  This format is felt to be most appropriate for this patient at this time.  All issues noted in this document were discussed and addressed.  No physical exam was performed (except for noted visual exam findings with Video Visits).  I discussed the limitations of evaluation and management by telemedicine and the availability of in person appointments. The patient expressed understanding and agreed to proceed.  I connected withNAME@ on 05/09/22 at  9:30 AM EDT by a video enabled telemedicine application and verified that I am speaking with the correct person using two identifiers.  Present at visit: Melanie Nutting, DO Melanie Hogan Patient's Brother-Larry  Patient Location: Home [address] [place] [address]   Provider location:   [facility]  Chief Complaint  Patient presents with   Follow-up    HPI  Melanie Hogan is a 51 y.o. female who presents via audio/video conferencing for a telehealth visit today.  Following up from recent ED visit.  Had intractable nausea and vomiting.  Labs relatively unremarkable.  CT abdomen and pelvis with mild thickening of the jejunum and large stool burden.  Since d/c from the ED she reports that she is is doing better.  Nausea and vomiting have improved.  Continues on Zofran as needed.  She is drinking a good amount of fluids.  Bowels are moving.     ROS:  A comprehensive ROS was completed and negative except as noted per HPI  Past Medical History:  Diagnosis Date   ALS (amyotrophic lateral sclerosis) ([place])    Anxiety    At high risk for falls    Depression    Migraines    Tobacco use disorder     Past Surgical History:  Procedure Laterality Date   ABDOMINAL HYSTERECTOMY     LAPAROSCOPIC NEPHRECTOMY Left  08/2016   donor   RADIOLOGY WITH ANESTHESIA N/A 04/04/2022   Procedure: MRI WITH ANESTHESIA;  Surgeon: Radiologist, Medication, MD;  Location: [place];  Service: Radiology;  Laterality: N/A;   TUBAL LIGATION      Family History  Problem Relation Age of Onset   Healthy Mother    Cancer Father     Social History   Socioeconomic History   Marital status: Single    Spouse name: Not on file   Number of children: Not on file   Years of education: Not on file   Highest education level: Not on file  Occupational History   Not on file  Tobacco Use   Smoking status: Every Day    Packs/day: 0.50    Years: 20.00    Total pack years: 10.00    Types: Cigarettes   Smokeless tobacco: Never  Vaping Use   Vaping Use: Never used  Substance and Sexual Activity   Alcohol use: Not Currently    Comment: 1-2 / year   Drug use: Not Currently   Sexual activity: Not Currently    Birth control/protection: Abstinence, Surgical  Other Topics Concern   Not on file  Social History Narrative   Not on file   Social Determinants of Health   Financial Resource Strain: Not on file  Food Insecurity: No Food Insecurity (04/07/2022)   Hunger Vital Sign    Worried About Running Out of Food in the Last Year: Never  true    Ran Out of Food in the Last Year: Never true  Transportation Needs: No Transportation Needs (04/07/2022)   PRAPARE - Hydrologist (Medical): No    Lack of Transportation (Non-Medical): No  Physical Activity: Not on file  Stress: Not on file  Social Connections: Not on file  Intimate Partner Violence: Not on file     Current Outpatient Medications:    baclofen (LIORESAL) 10 MG tablet, Take 10 mg by mouth 3 (three) times daily., Disp: , Rfl:    clotrimazole (MYCELEX) 10 MG troche, Take 1 tablet (10 mg total) by mouth 5 (five) times daily., Disp: 70 Troche, Rfl: 1   dexamethasone (DECADRON) 2 MG tablet, Take 1 tablet (2 mg total) by mouth 2 (two) times  daily with a meal. Take 2 tablets twice daily for 2 days and then decrease to 1 tablet twice daily for 2 weeks and then 1 tablet daily for 1 week and then discontinue, Disp: 45 tablet, Rfl: 0   diazepam (VALIUM) 5 MG tablet, Take 5 mg by mouth 3 (three) times daily as needed for anxiety., Disp: , Rfl:    escitalopram (LEXAPRO) 20 MG tablet, Take 1 tablet (20 mg total) by mouth at bedtime., Disp: 90 tablet, Rfl: 3   levETIRAcetam (KEPPRA) 500 MG tablet, Take 1 tablet (500 mg total) by mouth 2 (two) times daily., Disp: 60 tablet, Rfl: 2   ondansetron (ZOFRAN-ODT) 4 MG disintegrating tablet, Take 1 tablet (4 mg total) by mouth every 8 (eight) hours as needed for up to 3 days for nausea or vomiting., Disp: 15 tablet, Rfl: 0   polyethylene glycol (MIRALAX) 17 g packet, Take 17 g by mouth 2 (two) times daily., Disp: 14 each, Rfl: 0   senna (SENOKOT) 8.6 MG TABS tablet, Take 1 tablet (8.6 mg total) by mouth daily., Disp: 120 tablet, Rfl: 0   traZODone (DESYREL) 150 MG tablet, Take 1 tablet (150 mg total) by mouth at bedtime as needed. for sleep, Disp: 90 tablet, Rfl: 3   dexamethasone (DECADRON) 4 MG tablet, 1 tab QID for 3 days 1 tab TID for 3 days 1 tab BID for 3 days 1 tab Daily to continue (Patient not taking: Reported on 05/06/2022), Disp: 30 tablet, Rfl: 0   ondansetron (ZOFRAN) 8 MG tablet, Take 1 tablet (8 mg total) by mouth every 8 (eight) hours as needed for nausea or vomiting. (Patient not taking: Reported on 05/09/2022), Disp: 20 tablet, Rfl: 0   oxyCODONE (OXY IR/ROXICODONE) 5 MG immediate release tablet, Take 5 mg by mouth every 6 (six) hours as needed for severe pain. (Patient not taking: Reported on 05/09/2022), Disp: , Rfl:    pantoprazole (PROTONIX) 20 MG tablet, Take 1 tablet (20 mg total) by mouth daily., Disp: 30 tablet, Rfl: 0  EXAM:  VITALS per patient if applicable: BP 161/09   Pulse (!) 48   Ht '5\' 6"'$  (1.676 m)   Wt 120 lb (54.4 kg)   BMI 19.37 kg/m   GENERAL: alert, oriented,  appears well and in no acute distress  HEENT: atraumatic, conjunttiva clear, no obvious abnormalities on inspection of external nose and ears  NECK: normal movements of the head and neck  LUNGS: on inspection no signs of respiratory distress, breathing rate appears normal, no obvious gross SOB, gasping or wheezing  CV: no obvious cyanosis  MS: moves all visible extremities without noticeable abnormality  PSYCH/NEURO: pleasant and cooperative, no obvious depression or anxiety, speech  and thought processing grossly intact  ASSESSMENT AND PLAN:  Discussed the following assessment and plan:  Nausea and vomiting This has improved significantly.  Continue to increase fluids. Zofran as needed.  Contact clinic if having new/worsening symptoms.       I discussed the assessment and treatment plan with the patient. The patient was provided an opportunity to ask questions and all were answered. The patient agreed with the plan and demonstrated an understanding of the instructions.   The patient was advised to call back or seek an in-person evaluation if the symptoms worsen or if the condition fails to improve as anticipated.    Melanie Nutting, DO

## 2022-05-10 ENCOUNTER — Other Ambulatory Visit: Payer: Self-pay

## 2022-05-10 ENCOUNTER — Telehealth: Payer: Self-pay

## 2022-05-10 MED ORDER — ONDANSETRON HCL 8 MG PO TABS: 8.0000 mg | ORAL_TABLET | Freq: Three times a day (TID) | ORAL | 0 refills | Status: DC | PRN

## 2022-05-10 NOTE — Telephone Encounter (Signed)
     Patient  visit on 9/29  at [facility]   Have you been able to follow up with your primary care physician? YES  The patient was or was not able to obtain any needed medicine or equipment. YES  Are there diet recommendations that you are having difficulty following? NA  Patient expresses understanding of discharge instructions and education provided has no other needs at this time.  [place], [facility], Care Management  [phone] [address], [place], [place] [address] Phone: [phone] Email: [email or web address]

## 2022-05-12 ENCOUNTER — Encounter: Payer: Self-pay | Admitting: Nurse Practitioner

## 2022-05-12 ENCOUNTER — Inpatient Hospital Stay: Payer: Medicare HMO | Attending: Radiation Oncology | Admitting: Nurse Practitioner

## 2022-05-12 ENCOUNTER — Other Ambulatory Visit: Payer: Self-pay

## 2022-05-12 VITALS — BP 106/70 | HR 52 | Temp 98.2°F | Resp 15

## 2022-05-12 DIAGNOSIS — R5383 Other fatigue: Secondary | ICD-10-CM | POA: Insufficient documentation

## 2022-05-12 DIAGNOSIS — G1221 Amyotrophic lateral sclerosis: Secondary | ICD-10-CM | POA: Insufficient documentation

## 2022-05-12 DIAGNOSIS — K5900 Constipation, unspecified: Secondary | ICD-10-CM | POA: Diagnosis not present

## 2022-05-12 DIAGNOSIS — F419 Anxiety disorder, unspecified: Secondary | ICD-10-CM | POA: Diagnosis not present

## 2022-05-12 DIAGNOSIS — G939 Disorder of brain, unspecified: Secondary | ICD-10-CM | POA: Insufficient documentation

## 2022-05-12 DIAGNOSIS — R112 Nausea with vomiting, unspecified: Secondary | ICD-10-CM | POA: Insufficient documentation

## 2022-05-12 DIAGNOSIS — Z515 Encounter for palliative care: Secondary | ICD-10-CM

## 2022-05-12 DIAGNOSIS — R110 Nausea: Secondary | ICD-10-CM | POA: Diagnosis not present

## 2022-05-12 DIAGNOSIS — Z79899 Other long term (current) drug therapy: Secondary | ICD-10-CM | POA: Diagnosis not present

## 2022-05-12 DIAGNOSIS — R531 Weakness: Secondary | ICD-10-CM | POA: Diagnosis not present

## 2022-05-12 DIAGNOSIS — R51 Headache with orthostatic component, not elsewhere classified: Secondary | ICD-10-CM | POA: Diagnosis not present

## 2022-05-12 DIAGNOSIS — F17210 Nicotine dependence, cigarettes, uncomplicated: Secondary | ICD-10-CM | POA: Diagnosis not present

## 2022-05-12 DIAGNOSIS — R69 Illness, unspecified: Secondary | ICD-10-CM | POA: Diagnosis not present

## 2022-05-12 DIAGNOSIS — R918 Other nonspecific abnormal finding of lung field: Secondary | ICD-10-CM | POA: Diagnosis not present

## 2022-05-12 DIAGNOSIS — C7931 Secondary malignant neoplasm of brain: Secondary | ICD-10-CM

## 2022-05-12 DIAGNOSIS — Z7189 Other specified counseling: Secondary | ICD-10-CM

## 2022-05-12 DIAGNOSIS — Z85820 Personal history of malignant melanoma of skin: Secondary | ICD-10-CM | POA: Insufficient documentation

## 2022-05-12 MED ORDER — DEXAMETHASONE 2 MG PO TABS: ORAL_TABLET | ORAL | 0 refills | Status: DC

## 2022-05-12 MED ORDER — SORBITOL 70 % SOLN: 30.0000 mL | Freq: Every day | 1 refills | Status: DC | PRN

## 2022-05-12 NOTE — Progress Notes (Signed)
[facility]  [phone] [phone]   Name: Melanie Hogan Date: 05/12/2022 MRN: [id number]  DOB: 01/16/1971  Patient Care Team: [name], DO as PCP - General (Family Medicine)    REASON FOR CONSULTATION: Melanie Hogan is a 51 y.o. female with medical history including ALS, anxiety, stageIIA melanoma s/p resection at [facility]. Recently admitted and found to have multiple round lesions and vasogenic edema, numerous pulmonary nodules concerning for metastatic disease s/p whole brain radiation. Palliative ask to see for symptom management and goals of care.    SOCIAL HISTORY:     reports that she has been smoking cigarettes. She has a 10.00 pack-year smoking history. She has never used smokeless tobacco. She reports that she does not currently use alcohol. She reports that she does not currently use drugs.  ADVANCE DIRECTIVES:  Patient does not have a completed advanced directive however she has documents on hand. She has completed and pending finalization. We will make referral to Social Work. Document reviewed at length.   CODE STATUS: DNR  PAST MEDICAL HISTORY: Past Medical History:  Diagnosis Date   ALS (amyotrophic lateral sclerosis) ([place])    Anxiety    At high risk for falls    Depression    Migraines    Tobacco use disorder     PAST SURGICAL HISTORY:  Past Surgical History:  Procedure Laterality Date   ABDOMINAL HYSTERECTOMY     LAPAROSCOPIC NEPHRECTOMY Left 08/2016   donor   RADIOLOGY WITH ANESTHESIA N/A 04/04/2022   Procedure: MRI WITH ANESTHESIA;  Surgeon: Radiologist, Medication, MD;  Location: [place];  Service: Radiology;  Laterality: N/A;   TUBAL LIGATION      HEMATOLOGY/ONCOLOGY HISTORY:  Oncology History   No history exists.    ALLERGIES:  is allergic to morphine and riluzole.  MEDICATIONS:  Current Outpatient Medications  Medication Sig Dispense Refill   sorbitol  70 % SOLN Take 30 mLs by mouth daily as needed for moderate constipation or severe constipation. 473 mL 1   baclofen (LIORESAL) 10 MG tablet Take 10 mg by mouth 3 (three) times daily.     clotrimazole (MYCELEX) 10 MG troche Take 1 tablet (10 mg total) by mouth 5 (five) times daily. 70 Troche 1   dexamethasone (DECADRON) 2 MG tablet Take 1 tablets twice daily for 7 days and then decrease to 1 tablet daily for 2 weeks and then discontinue 30 tablet 0   diazepam (VALIUM) 5 MG tablet Take 5 mg by mouth 3 (three) times daily as needed for anxiety.     escitalopram (LEXAPRO) 20 MG tablet Take 1 tablet (20 mg total) by mouth at bedtime. 90 tablet 3   levETIRAcetam (KEPPRA) 500 MG tablet Take 1 tablet (500 mg total) by mouth 2 (two) times daily. 60 tablet 2   ondansetron (ZOFRAN) 8 MG tablet Take 1 tablet (8 mg total) by mouth every 8 (eight) hours as needed for nausea or vomiting. 20 tablet 0   oxyCODONE (OXY IR/ROXICODONE) 5 MG immediate release tablet Take 5 mg by mouth every 6 (six) hours as needed for severe pain. (Patient not taking: Reported on 05/09/2022)     pantoprazole (PROTONIX) 20 MG tablet Take 1 tablet (20 mg total) by mouth daily. 30 tablet 0   polyethylene glycol (MIRALAX) 17 g packet Take 17 g by mouth 2 (two) times daily. 14 each 0   senna (SENOKOT) 8.6 MG TABS tablet Take 1 tablet (  8.6 mg total) by mouth daily. 120 tablet 0   traZODone (DESYREL) 150 MG tablet Take 1 tablet (150 mg total) by mouth at bedtime as needed. for sleep 90 tablet 3   No current facility-administered medications for this visit.    VITAL SIGNS: BP 106/70 (BP Location: Left Arm, Patient Position: Sitting)   Pulse (!) 52   Temp 98.2 F (36.8 C) (Oral)   Resp 15   SpO2 97%  There were no vitals filed for this visit.  Estimated body mass index is 19.37 kg/m as calculated from the following:   Height as of 05/09/22: '5\' 6"'$  (1.676 m).   Weight as of 05/09/22: 120 lb (54.4 kg).  LABS: CBC:    Component Value  Date/Time   WBC 9.0 05/06/2022 0036   HGB 11.2 (L) 05/06/2022 0110   HCT 33.0 (L) 05/06/2022 0110   PLT 316 05/06/2022 0036   MCV 94.4 05/06/2022 0036   NEUTROABS 6.7 05/06/2022 0036   LYMPHSABS 1.1 05/06/2022 0036   MONOABS 1.0 05/06/2022 0036   EOSABS 0.1 05/06/2022 0036   BASOSABS 0.0 05/06/2022 0036   Comprehensive Metabolic Panel:    Component Value Date/Time   NA 142 05/06/2022 0110   K 3.8 05/06/2022 0110   CL 105 05/06/2022 0110   CO2 27 05/06/2022 0036   BUN 18 05/06/2022 0110   CREATININE 0.70 05/06/2022 0110   CREATININE 1.19 (H) 12/11/2019 1404   GLUCOSE 94 05/06/2022 0110   CALCIUM 8.8 (L) 05/06/2022 0036   AST 15 05/06/2022 0036   ALT 16 05/06/2022 0036   ALKPHOS 76 05/06/2022 0036   BILITOT 0.4 05/06/2022 0036   PROT 6.1 (L) 05/06/2022 0036   ALBUMIN 3.1 (L) 05/06/2022 0036    RADIOGRAPHIC STUDIES: CT ABDOMEN PELVIS W CONTRAST  Result Date: 05/06/2022 CLINICAL DATA:  Acute nonlocalized abdominal pain; history of nephrectomy, hysterectomy, tubal ligation EXAM: CT ABDOMEN AND PELVIS WITH CONTRAST TECHNIQUE: Multidetector CT imaging of the abdomen and pelvis was performed using the standard protocol following bolus administration of intravenous contrast. RADIATION DOSE REDUCTION: This exam was performed according to the departmental dose-optimization program which includes automated exposure control, adjustment of the mA and/or kV according to patient size and/or use of iterative reconstruction technique. CONTRAST:  68m OMNIPAQUE IOHEXOL 300 MG/ML  SOLN COMPARISON:  CT abdomen and pelvis 07/15/2016 FINDINGS: Lower chest: Bibasilar atelectasis/scarring.  No acute abnormality. Hepatobiliary: No suspicious liver lesion. Vicarious excretion of contrast in the gallbladder. No biliary ductal dilation. Pancreas: Unremarkable. No pancreatic ductal dilatation or surrounding inflammatory changes. Spleen: Normal in size without focal abnormality. Adrenals/Urinary Tract:  Unremarkable adrenal glands. Left nephrectomy. No obstructing urinary calculi or hydronephrosis. Contrast from earlier CT from 05/05/2022 within the right renal collecting system, ureter, and bladder. Stomach/Bowel: Unremarkable stomach. Question mild wall thickening in loops of jejunum versus underdistention. No adjacent inflammatory stranding. Large colonic stool burden. Vascular/Lymphatic: Aortic atherosclerosis. No enlarged abdominal or pelvic lymph nodes. Reproductive: Status post hysterectomy. No adnexal masses. Other: No free intraperitoneal fluid or air. Musculoskeletal: Demineralization.  No acute osseous abnormality. IMPRESSION: Wall thickening in the jejunum may be due to underdistention versus mild enteritis. Large colonic stool burden.  Recommend correlation for constipation. Electronically Signed   By: TPlacido SouM.D.   On: 05/06/2022 02:13   DG Chest Portable 1 View  Result Date: 05/06/2022 CLINICAL DATA:  Cough EXAM: PORTABLE CHEST 1 VIEW COMPARISON:  None Available. FINDINGS: No focal consolidation, pleural effusion, or pneumothorax. Normal cardiomediastinal silhouette. No acute osseous abnormality.  IMPRESSION: No active disease. Electronically Signed   By: Placido Sou M.D.   On: 05/06/2022 01:02   CT HEAD W & WO CONTRAST (5MM)  Result Date: 05/05/2022 CLINICAL DATA:  Metastatic melanoma. Assess response to whole-brain radiation. Rule out hemorrhage. EXAM: CT HEAD WITHOUT AND WITH CONTRAST TECHNIQUE: Contiguous axial images were obtained from the base of the skull through the vertex without and with intravenous contrast. RADIATION DOSE REDUCTION: This exam was performed according to the departmental dose-optimization program which includes automated exposure control, adjustment of the mA and/or kV according to patient size and/or use of iterative reconstruction technique. CONTRAST:  55m OMNIPAQUE IOHEXOL 300 MG/ML  SOLN COMPARISON:  MRI head with contrast 04/04/2022 FINDINGS:  Brain: Multiple enhancing lesions are present in the brain compatible with metastatic disease. Overall the lesions appear slightly smaller compared with the prior MRI. Right temporal necrotic lesion axial image 10. This currently measures 25 mm, previously 27 mm. Adjacent right temporal lobe lesion now measures 10.7 mm, previously 16 mm. Moderate edema in the right temporal lobe slightly improved. Left medial parietal lesion now measures 15 mm previously 15 mm. Surrounding edema unchanged. High right parietal lesion currently measures 16 mm, previously 19 mm. Axial image 27 Multiple additional small lesions are present throughout both cerebral hemispheres most of which appears slightly smaller. Small lesions in the cerebellum previously are not identified by CT 4 mm midline shift to the left previously measured at 5 mm. No hydrocephalus. Thick wall of the largest lesion in the right temporal lobe is approximally isodense to brain. No definite hemorrhage in this lesion or other lesions. Vascular: Negative for hyperdense vessel. Normal vascular enhancement. Skull: Negative for metastatic disease Sinuses/Orbits: Mucosal edema right sphenoid sinus has developed since the prior study. Remaining sinuses clear. Negative orbit Other: None IMPRESSION: 1. Multiple enhancing metastatic lesions in the brain compatible with metastatic disease. Overall the lesions appear slightly smaller compared with the prior MRI. 2. 4 mm midline shift to the left previously measured at 5 mm. 3. No acute hemorrhage. 4. Mucosal edema right sphenoid sinus has developed since the prior study. Electronically Signed   By: CFranchot GalloM.D.   On: 05/05/2022 13:59    PERFORMANCE STATUS (ECOG) : 3 - Symptomatic, >50% confined to bed  Review of Systems  Constitutional:  Positive for fatigue.  Musculoskeletal:  Positive for arthralgias.  Neurological:  Positive for weakness and headaches.       Slurred speech due to ALS, wheelchair bound    Unless otherwise noted, a complete review of systems is negative.  Physical Exam General: NAD, thin, sitting  Pulmonary: normal breathing pattern  Abdomen: soft, nontender, + bowel sounds Extremities: no edema, weakness Skin: no rashes Neurological: AAO x3, slurred speech   IMPRESSION: This is my initial visit with Melanie Hogan. She was seen initially by our Palliative team during recent admission. No acute distress noted. She is wheelchair bound. Melanie mother and brother are present with Melanie. She is alert and able to engage appropriately in discussions.   I introduced myself, [place] RN, and Palliative's role in collaboration with the oncology team. Concept of Palliative Care was introduced as specialized medical care for people and their families living with serious illness.  It focuses on providing relief from the symptoms and stress of a serious illness.  The goal is to improve quality of life for both the patient and the family. Values and goals of care important to patient and family were attempted to be elicited.  Melanie Hogan lives at home with Melanie brother, Fritz Pickerel.  Melanie mother lives in the same apartment complex and comes to Melanie home daily to assist with care.  Patient is single with no children.  She formally worked in [employer].  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  Melanie Hogan is wheelchair-bound and requires total assistance with ADLs.  Occasional incontinence episodes.  Melanie mother and sisters assist with personal care while Melanie brother assist with meals and other needs. He manages Melanie medications and appointments.   Patient complains of occasional headaches which is controlled with medication. She is taking baclofen and Oxy IR as needed. Does not require around the clock. May not require on some days.   Concerns with constipation. Patient and family  reports she can potentially go several days without a bowel movement. She has been taking miralax and senna with minimal relief, however brother confirms she may not take miralax regularly. We discussed importance in bowel regimen to prevent pain, discomfort, nausea, and other complications.   Melanie nausea is improved. We discussed Melanie dexamethasone use. Education provided to continue taking twice daily for another week and then titrate to once daily for two weeks as advised by Dr. Tammi Klippel.   We discussed Melanie current illness and what it means in the larger context of Melanie Hogan. Natural disease trajectory and expectations were discussed.  Melanie Hogan and Melanie family are realistic in their understanding. Melanie goal is to continue to treat the treatable taking things one day at a time. She and family are in the process of completing advanced directive. She has Melanie packet with Melanie on today which is completed pending signatures and being notarized. We reviewed documents at length and all questions answered. I introduced MOST form. She would like to take document home and review with request to complete at follow-up appointment.   I approached discussions regarding wishes. Melanie Hogan and family are clear she does not want heroic or life prolonging measures confirming DNR/DNI. Out of facility form completed and given to patient.   I discussed the importance of continued conversation with family and their medical providers regarding overall plan of care and treatment options, ensuring decisions are within the context of the patients values and GOCs.  PLAN: Established therapeutic relationship. Education provided on palliative's role in collaboration with their Oncology/Radiation team. Oxycodone IR '5mg'$  as needed for pain Sorbitol as needed for constipation Continue dexamethasone as prescribed Baclofen as needed  Extensive goals of care discussions. Patient and family realistic. Goals are clear to  take things one day at a time, managing symptoms. Confirmed DNR/DNI.  Reviewed advanced directives. Patient has completed documents in preparation for final signatures and notarization. Referral to AD clinic made.  I will plan to see patient back in 2-4 weeks in collaboration to other oncology appointments.    Patient expressed understanding and was in agreement with this plan. She also understands that She can call the clinic at any time with any questions, concerns, or complaints.   Thank you for your referral and allowing Palliative to assist in Melanie Hogan's care.   Number and complexity of problems addressed: HIGH - 1 or more chronic illnesses with SEVERE exacerbation, progression, or side effects of treatment - advanced cancer, pain. Any controlled substances utilized were prescribed in the context of palliative care.  Time Total: 50 min   Visit consisted  of counseling and education dealing with the complex and emotionally intense issues of symptom management and palliative care in the setting of serious and potentially life-threatening illness.Greater than 50%  of this time was spent counseling and coordinating care related to the above assessment and plan.  Signed by: Alda Lea, AGPCNP-BC Palliative Medicine Team/[place]

## 2022-05-16 ENCOUNTER — Telehealth: Payer: Self-pay

## 2022-05-16 NOTE — Telephone Encounter (Signed)
CSW attempted to contact patient to discuss [facility] per the request of [place].  Her vm was full and CSW could not leave a message.  CSW to follow up.

## 2022-05-16 NOTE — Telephone Encounter (Signed)
Brother called to say that [facility] would not pay for Decadron 2 mg refill sent in on 05/12/22. Called [facility]. Pharmacist ran it through insurance as a change in therapy but refill was declined until 05/23/22.   Called patient back and he will pay out of pocket. Encouraged him to download GoodRx to bring cost down.

## 2022-05-24 ENCOUNTER — Other Ambulatory Visit: Payer: Self-pay | Admitting: Family Medicine

## 2022-05-26 ENCOUNTER — Telehealth: Payer: Self-pay

## 2022-05-26 NOTE — Telephone Encounter (Signed)
CSW attempted to contact patient to provide information on the [facility].  Her vm was full.  CSW sent her an email with the information.

## 2022-05-31 ENCOUNTER — Other Ambulatory Visit: Payer: Self-pay | Admitting: Family Medicine

## 2022-06-01 ENCOUNTER — Encounter: Payer: Self-pay | Admitting: Urology

## 2022-06-01 ENCOUNTER — Other Ambulatory Visit: Payer: Self-pay

## 2022-06-01 MED ORDER — PANTOPRAZOLE SODIUM 20 MG PO TBEC: 20.0000 mg | DELAYED_RELEASE_TABLET | Freq: Every day | ORAL | 2 refills | Status: DC

## 2022-06-01 MED ORDER — ONDANSETRON HCL 8 MG PO TABS: ORAL_TABLET | ORAL | 2 refills | Status: DC

## 2022-06-01 NOTE — Progress Notes (Signed)
  Radiation Oncology         [phone] ________________________________  Name: Melanie Hogan MRN: [id number]  Date: 04/20/2022  DOB: 08/18/1970  End of Treatment Note  Diagnosis:   51 yo woman with at least 18 brain metastases from melanoma      Indication for treatment:  Palliation       Radiation treatment dates:   04/06/22 - 04/20/22  Site/dose:   The whole brain was treated to 30 Gy in 10 fractions of 3 Gy  Beams/energy:   Right and Left radiation fields were treated using 6 MV X-rays with custom MLC collimation to shield the eyes and face.  The patient was immobilized with a thermoplastic mask and isocenter was verified with weekly port films.  Narrative: The patient tolerated radiation treatment relatively well with modest fatigue but no N/V or headaches.  Plan: The patient has completed radiation treatment. The patient will return to radiation oncology clinic for routine followup in one month. I advised them to call or return sooner if they have any questions or concerns related to their recovery or treatment. ________________________________  Sheral Apley. Tammi Klippel, M.D.

## 2022-06-02 ENCOUNTER — Telehealth: Payer: Self-pay

## 2022-06-02 NOTE — Telephone Encounter (Addendum)
I verified patient's identity and notified her of the message below.   Per Ashlyn Bruning PA-C-------Please call patient to advise that I have ordered a post-treatment MRI brain scan which is typically done approximately 3 months after treatment which would be in 07/2022 and Mont Dutton will call her to coordinate the scan which will be reviewed in our multidisciplinary conference prior to a telephone follow up visit with me thereafter to review results and recommendations. Pending this scan is stable without evidence of disease progression in the brain, she will not need further brain radiation. Based on Dr. Hazeline Junker note (medical oncologist) from consult in the hospital on 04/04/22, due to the widespread, Stage IV disease involving the lungs and brain, he did not feel that she would benefit from aggressive treatment with systemic therapy which would require tissue biopsy to confirm this is indeed metastatic melanoma, as it appears radiographically. It sounds like they were all in agreement at that time but if she has changed her mind and wants to be more aggressive, she will need a follow up in medical oncology, either with Dr. Alen Blew, or getting back in with her medical oncologist, Dr. Georgiann Cocker, at [facility] whom she has seen for years previously. They would be better able to guide her regarding completing disease staging to determine a more accurate prognosis with or without treatment. Palliative care remains involved as well and they can also help with determining prognosis.Marland Kitchen we only gave palliative radiation to the brain to control the metastatic disease present currently and to help slow progression of disease in the brain but she will almost certainly have continued progression of disease into the brain at some point if she is not on systemic therapy. If no systemic therapy is recommended, Hospice care could get involved anytime and Palliative Care could help make that connection.  As far as the side effects  from brain radiation, the skin irritation typically begins to improve within just a few weeks of completing radiation but can take 2-3 months to fully resolve. Hair re-growth typically takes 6 months+ and the tiredness/fatigue can last 2-3 months as well. Tiredness is usually at it's peak around 4 weeks after radiation for about 3-4 weeks before it begins to improve but it will.  -Ashlyn  Patient verbalized understanding of information and all questions addressed. This concludes this conversation.   Leandra Kern, LPN

## 2022-06-08 ENCOUNTER — Other Ambulatory Visit: Payer: Self-pay | Admitting: Urology

## 2022-06-08 ENCOUNTER — Other Ambulatory Visit: Payer: Self-pay | Admitting: Radiation Therapy

## 2022-06-08 ENCOUNTER — Ambulatory Visit
Admission: RE | Admit: 2022-06-08 | Discharge: 2022-06-08 | Disposition: A | Discharge status: Home / Self Care | Payer: Medicare HMO | Source: Ambulatory Visit | Attending: Urology | Admitting: Urology

## 2022-06-08 ENCOUNTER — Ambulatory Visit
Admission: RE | Admit: 2022-06-08 | Discharge: 2022-06-08 | Disposition: A | Discharge status: Home / Self Care | Payer: Medicare HMO | Source: Ambulatory Visit | Attending: Radiation Oncology | Admitting: Radiation Oncology

## 2022-06-08 DIAGNOSIS — C7931 Secondary malignant neoplasm of brain: Secondary | ICD-10-CM

## 2022-06-08 DIAGNOSIS — K5900 Constipation, unspecified: Secondary | ICD-10-CM

## 2022-06-08 DIAGNOSIS — Z515 Encounter for palliative care: Secondary | ICD-10-CM

## 2022-06-08 MED ORDER — DEXAMETHASONE 2 MG PO TABS: ORAL_TABLET | ORAL | 0 refills | Status: DC

## 2022-06-08 NOTE — Progress Notes (Signed)
Radiation Oncology         [phone] ________________________________  Name: Melanie Hogan MRN: [id number]  Date: 06/08/2022  DOB: 05-06-1971  Post Treatment Note  CC: [name], DO  Lequita Halt, MD  Diagnosis:   51 yo woman with at least 39 brain metastases from melanoma       Interval Since Last Radiation:  7 weeks  04/06/22 - 04/20/22: The whole brain was treated to 30 Gy in 10 fractions of 3 Gy  Narrative:  The patient returns today for routine follow-up.  She  tolerated radiation treatment relatively well with modest fatigue but no N/V or headaches.                               On review of systems, the patient states that she is doing well in general aside from a return of headaches since stopping her low dose ('2mg'$  decadron) steroids 4 days ago. She also reports significant fatigue over the past 2-3 weeks but this seems to be gradually improving this week. Otherwise, she is doing well and denies chest pain, shortness of breath, productive cough, hemoptysis, fever, chills, nausea or vomiting.  Overall, she is pleased with her progress to date.  ALLERGIES:  is allergic to morphine and riluzole.  Meds: Current Outpatient Medications  Medication Sig Dispense Refill   baclofen (LIORESAL) 10 MG tablet Take 10 mg by mouth 3 (three) times daily.     clotrimazole (MYCELEX) 10 MG troche Take 1 tablet (10 mg total) by mouth 5 (five) times daily. 70 Troche 1   dexamethasone (DECADRON) 2 MG tablet Take 1 tablet daily until your next follow up visit after MRI brain scan 30 tablet 0   diazepam (VALIUM) 5 MG tablet Take 5 mg by mouth 3 (three) times daily as needed for anxiety.     escitalopram (LEXAPRO) 20 MG tablet Take 1 tablet (20 mg total) by mouth at bedtime. 90 tablet 3   levETIRAcetam (KEPPRA) 500 MG tablet Take 1 tablet (500 mg total) by mouth 2 (two) times daily. 60 tablet 2   ondansetron (ZOFRAN) 8 MG tablet TAKE 1 TABLET(8 MG) BY MOUTH EVERY 8 HOURS AS NEEDED FOR  NAUSEA OR VOMITING 30 tablet 2   oxyCODONE (OXY IR/ROXICODONE) 5 MG immediate release tablet Take 5 mg by mouth every 6 (six) hours as needed for severe pain. (Patient not taking: Reported on 05/09/2022)     pantoprazole (PROTONIX) 20 MG tablet Take 1 tablet (20 mg total) by mouth daily. 30 tablet 2   polyethylene glycol (MIRALAX) 17 g packet Take 17 g by mouth 2 (two) times daily. 14 each 0   senna (SENOKOT) 8.6 MG TABS tablet Take 1 tablet (8.6 mg total) by mouth daily. 120 tablet 0   sorbitol 70 % SOLN Take 30 mLs by mouth daily as needed for moderate constipation or severe constipation. 473 mL 1   traZODone (DESYREL) 150 MG tablet Take 1 tablet (150 mg total) by mouth at bedtime as needed. for sleep 90 tablet 3   No current facility-administered medications for this encounter.    Physical Findings:  vitals were not taken for this visit.   /10 In general this is a well appearing Caucasian female in no acute distress. She is wheelchair dependent secondary to ALS. She's alert and oriented x4 and appropriate throughout the examination. Cardiopulmonary assessment is negative for acute distress and she exhibits normal effort.  Lab Findings: Lab Results  Component Value Date   WBC 9.0 05/06/2022   HGB 11.2 (L) 05/06/2022   HCT 33.0 (L) 05/06/2022   MCV 94.4 05/06/2022   PLT 316 05/06/2022     Radiographic Findings: No results found.  Impression/Plan: 56. 51 yo woman with at least 29 brain metastases from melanoma. She appears to be recovering well from the effects of her recent whole brain radiation.  We did discuss resuming the 2 mg Decadron nightly since this was controlling her headaches and helping with her appetite prior to her discontinuing it 4 days ago.  She is not interested in pursuing systemic/chemotherapy based on her previous discussion with Dr. Alen Blew at the time of consult in the hospital on 04/04/2022 but she would like to continue with follow-up imaging to monitor the  progression of her disease and continue discussions regarding her prognosis.  She does not currently have any scheduled follow-up visit with Dr. Alen Blew but I advised that I will share this information today and that she will likely hear from one of his staff this week to get a follow-up visit and repeat imaging scheduled.  Regarding the brain disease, we discussed the plan to proceed with serial MRI brain imaging every 3 months to monitor for any evidence of disease progression or recurrence and I will follow up with them by telephone following each scan to review results and recommendations from the multidisciplinary brain conference.  She will also continue in routine follow-up with the palliative care team to discuss future enrollment in hospice care.  She and her husband know that they are welcome to call at anytime in the interim with any questions or concerns related to her previous radiation.        Nicholos Johns, PA-C

## 2022-06-08 NOTE — Progress Notes (Signed)
  Radiation Oncology         [phone] ________________________________  Name: Melanie Hogan MRN: [id number]  Date of Service: 06/08/2022  DOB: [birth year]  Post Treatment Telephone Note  Diagnosis:  51 yo woman with at least 39 brain metastases from melanoma     (as documented in provider EOT note)   The patient was not available for call today. A detailed voicemail was left at [phone].    The patient was counseled that she will be contacted by our brain and spine navigator to schedule surveillance imaging. The patient was encouraged to call if  she have not received a call to schedule imaging, or if she develop concerns or questions regarding radiation. The patient will also continue to follow up with Dr. Alen Blew in medical oncology.  This concludes this call.   Leandra Kern, LPN

## 2022-06-13 ENCOUNTER — Inpatient Hospital Stay: Payer: Medicare HMO | Attending: Radiation Oncology | Admitting: Nurse Practitioner

## 2022-06-13 DIAGNOSIS — R519 Headache, unspecified: Secondary | ICD-10-CM | POA: Insufficient documentation

## 2022-06-13 DIAGNOSIS — C4359 Malignant melanoma of other part of trunk: Secondary | ICD-10-CM | POA: Insufficient documentation

## 2022-06-13 DIAGNOSIS — F17210 Nicotine dependence, cigarettes, uncomplicated: Secondary | ICD-10-CM | POA: Diagnosis not present

## 2022-06-13 DIAGNOSIS — R918 Other nonspecific abnormal finding of lung field: Secondary | ICD-10-CM | POA: Insufficient documentation

## 2022-06-13 DIAGNOSIS — Z923 Personal history of irradiation: Secondary | ICD-10-CM | POA: Insufficient documentation

## 2022-06-13 DIAGNOSIS — K5900 Constipation, unspecified: Secondary | ICD-10-CM | POA: Diagnosis not present

## 2022-06-13 DIAGNOSIS — G893 Neoplasm related pain (acute) (chronic): Secondary | ICD-10-CM | POA: Diagnosis not present

## 2022-06-13 DIAGNOSIS — Z79899 Other long term (current) drug therapy: Secondary | ICD-10-CM | POA: Insufficient documentation

## 2022-06-13 DIAGNOSIS — C7931 Secondary malignant neoplasm of brain: Secondary | ICD-10-CM | POA: Insufficient documentation

## 2022-06-13 DIAGNOSIS — G1221 Amyotrophic lateral sclerosis: Secondary | ICD-10-CM | POA: Insufficient documentation

## 2022-06-13 DIAGNOSIS — Z515 Encounter for palliative care: Secondary | ICD-10-CM

## 2022-06-13 DIAGNOSIS — Z7189 Other specified counseling: Secondary | ICD-10-CM | POA: Diagnosis not present

## 2022-06-13 DIAGNOSIS — R69 Illness, unspecified: Secondary | ICD-10-CM | POA: Diagnosis not present

## 2022-06-13 MED ORDER — OXYCODONE HCL 5 MG PO TABS: 5.0000 mg | ORAL_TABLET | Freq: Four times a day (QID) | ORAL | 0 refills | Status: DC | PRN

## 2022-06-13 MED ORDER — DIAZEPAM 5 MG PO TABS: 5.0000 mg | ORAL_TABLET | Freq: Three times a day (TID) | ORAL | 1 refills | Status: DC | PRN

## 2022-06-13 NOTE — Progress Notes (Signed)
[place] [facility] [place]  [phone] [phone]) [phone]   Name: Melanie Hogan Date: 06/13/2022 MRN: [id number]  DOB: March 19, 1971  Patient Care Team: Melanie Nutting, DO as PCP - General (Family Medicine) Hogan, Melanie Sax, NP as Nurse Practitioner (Nurse Practitioner)   I connected with Melanie Hogan on 06/13/22 at 12:30 PM EST by video visit and verified that I am speaking with the correct person using two identifiers.   I discussed the limitations, risks, security and privacy concerns of performing an evaluation and management service by telemedicine and the availability of in-person appointments. I also discussed with the patient that there may be a patient responsible charge related to this service. The patient expressed understanding and agreed to proceed.   Other persons participating in the visit and their role in the encounter: patient's mother and Maygan, RN    Patient's location: Home   Provider's location: [place]    INTERVAL HISTORY: Melanie Hogan is a 50 y.o. female with oncologic medical history including ALS, anxiety, stageIIA melanoma s/p resection at [place]. Recently admitted and found to have multiple round lesions and vasogenic edema, numerous pulmonary nodules concerning for metastatic disease s/p whole brain radiation. Palliative ask to see for symptom management and goals of care.   SOCIAL HISTORY:     reports that she has been smoking cigarettes. She has a 10.00 pack-year smoking history. She has never used smokeless tobacco. She reports that she does not currently use alcohol. She reports that she does not currently use drugs.  ADVANCE DIRECTIVES:  Patient does not have a completed advanced directive however she has documents on hand. She has completed and pending finalization. We will make referral to Social Work. Document reviewed at length.    CODE STATUS: DNR  PAST  MEDICAL HISTORY: Past Medical History:  Diagnosis Date   ALS (amyotrophic lateral sclerosis) (HCC)    Anxiety    At high risk for falls    Depression    Migraines    Tobacco use disorder     ALLERGIES:  is allergic to morphine and riluzole.  MEDICATIONS:  Current Outpatient Medications  Medication Sig Dispense Refill   baclofen (LIORESAL) 10 MG tablet Take 10 mg by mouth 3 (three) times daily.     clotrimazole (MYCELEX) 10 MG troche Take 1 tablet (10 mg total) by mouth 5 (five) times daily. 70 Troche 1   dexamethasone (DECADRON) 2 MG tablet Take 1 tablet daily until your next follow up visit after MRI brain scan 30 tablet 0   diazepam (VALIUM) 5 MG tablet Take 1 tablet (5 mg total) by mouth 3 (three) times daily as needed for anxiety. 30 tablet 1   escitalopram (LEXAPRO) 20 MG tablet Take 1 tablet (20 mg total) by mouth at bedtime. 90 tablet 3   levETIRAcetam (KEPPRA) 500 MG tablet Take 1 tablet (500 mg total) by mouth 2 (two) times daily. 60 tablet 2   ondansetron (ZOFRAN) 8 MG tablet TAKE 1 TABLET(8 MG) BY MOUTH EVERY 8 HOURS AS NEEDED FOR NAUSEA OR VOMITING 30 tablet 2   oxyCODONE (OXY IR/ROXICODONE) 5 MG immediate release tablet Take 1 tablet (5 mg total) by mouth every 6 (six) hours as needed for severe pain. 30 tablet 0   pantoprazole (PROTONIX) 20 MG tablet Take 1 tablet (20 mg total) by mouth daily. 30 tablet 2   polyethylene glycol (MIRALAX) 17 g packet Take 17 g by mouth 2 (two)  times daily. 14 each 0   senna (SENOKOT) 8.6 MG TABS tablet Take 1 tablet (8.6 mg total) by mouth daily. 120 tablet 0   sorbitol 70 % SOLN Take 30 mLs by mouth daily as needed for moderate constipation or severe constipation. 473 mL 1   traZODone (DESYREL) 150 MG tablet Take 1 tablet (150 mg total) by mouth at bedtime as needed. for sleep 90 tablet 3   No current facility-administered medications for this visit.    VITAL SIGNS: There were no vitals taken for this visit. There were no vitals filed  for this visit.  Estimated body mass index is 19.37 kg/m as calculated from the following:   Height as of 05/09/22: '5\' 6"'$  (1.676 m).   Weight as of 05/09/22: 120 lb (54.4 kg).   PERFORMANCE STATUS (ECOG) : 3 - Symptomatic, >50% confined to bed   Physical Exam General: NAD Cardiovascular: regular rate and rhythm Pulmonary: normal breathing pattern Neurological: AAO x3, able to engage   IMPRESSION: I connected with Melanie Hogan via My Chart video visit. Her mother was also present. No acute distress noted. She was alert and able to engage appropriately in discussions.  States she has been doing well.  She has an upcoming brain MRI scan at the end of this month which she is anxiously awaiting.  Denies any diarrhea or constipation.  Since beginning bowel regimen she has been able to go to the bathroom regularly.  Endorses occasional pains and headaches which she feels is controlled with her oxycodone.  States she does have occasional nausea with some vomiting episodes.  Symptoms are decreased when taking ondansetron.  Continues to have some anxiety at times.  She does have Valium on hand at home which she does not require daily.  This does help when she needs it.  Continues to try to remain as active as possible doing as many ADLs as she can for herself.  We discussed Her current illness and what it means in the larger context of Her on-going co-morbidities. Natural disease trajectory and expectations were discussed.  Goals of care discussions and advance care planning.  Melanie Hogan and her mother states they would like to discuss things further once she has completed recent MRI and have a better understanding of where things are.  I discussed the importance of continued conversation with family and their medical providers regarding overall plan of care and treatment options, ensuring decisions are within the context of the patients values and GOCs.  PLAN: Oxycodone 5 mg every 6 hours as needed  for pain  ondansetron 8 mg as needed for nausea Valium 5 mg as needed for anxiety Sorbitol as needed for constipation Baclofen 3 times daily We reviewed at length her medication regimen.  She and her mother verbalized understanding. Ongoing goals of care discussions and complex decision making. I will plan to see patient back in 2-3 weeks s/p oncology follow-up including brain MRI.  Patient and family knows to contact our office sooner if needed.   Patient expressed understanding and was in agreement with this plan. She also understands that She can call the clinic at any time with any questions, concerns, or complaints.   Any controlled substances utilized were prescribed in the context of palliative care. PDMP has been reviewed.   Time Total: 45 min   Visit consisted of counseling and education dealing with the complex and emotionally intense issues of symptom management and palliative care in the setting of serious and potentially life-threatening illness.Greater than  50%  of this time was spent counseling and coordinating care related to the above assessment and plan.  Alda Lea, AGPCNP-BC  Palliative Medicine Team/[place]

## 2022-06-28 ENCOUNTER — Encounter: Payer: Self-pay | Admitting: Nurse Practitioner

## 2022-06-28 ENCOUNTER — Encounter: Payer: Self-pay | Admitting: Urology

## 2022-06-28 ENCOUNTER — Ambulatory Visit
Admission: RE | Admit: 2022-06-28 | Discharge: 2022-06-28 | Disposition: A | Discharge status: Home / Self Care | Payer: Medicare HMO | Source: Ambulatory Visit | Attending: Urology | Admitting: Urology

## 2022-06-28 ENCOUNTER — Inpatient Hospital Stay (HOSPITAL_BASED_OUTPATIENT_CLINIC_OR_DEPARTMENT_OTHER): Payer: Medicare HMO | Admitting: Nurse Practitioner

## 2022-06-28 ENCOUNTER — Inpatient Hospital Stay (HOSPITAL_BASED_OUTPATIENT_CLINIC_OR_DEPARTMENT_OTHER): Payer: Medicare HMO | Admitting: Oncology

## 2022-06-28 VITALS — BP 112/76 | HR 74 | Temp 98.7°F | Resp 14 | Ht 66.0 in

## 2022-06-28 DIAGNOSIS — C7931 Secondary malignant neoplasm of brain: Secondary | ICD-10-CM | POA: Insufficient documentation

## 2022-06-28 DIAGNOSIS — R110 Nausea: Secondary | ICD-10-CM

## 2022-06-28 DIAGNOSIS — C4359 Malignant melanoma of other part of trunk: Secondary | ICD-10-CM

## 2022-06-28 DIAGNOSIS — Z79899 Other long term (current) drug therapy: Secondary | ICD-10-CM | POA: Diagnosis not present

## 2022-06-28 DIAGNOSIS — Z7189 Other specified counseling: Secondary | ICD-10-CM | POA: Diagnosis not present

## 2022-06-28 DIAGNOSIS — Z515 Encounter for palliative care: Secondary | ICD-10-CM | POA: Diagnosis not present

## 2022-06-28 DIAGNOSIS — G893 Neoplasm related pain (acute) (chronic): Secondary | ICD-10-CM | POA: Diagnosis not present

## 2022-06-28 DIAGNOSIS — R530 Neoplastic (malignant) related fatigue: Secondary | ICD-10-CM

## 2022-06-28 DIAGNOSIS — G1221 Amyotrophic lateral sclerosis: Secondary | ICD-10-CM | POA: Diagnosis not present

## 2022-06-28 DIAGNOSIS — R918 Other nonspecific abnormal finding of lung field: Secondary | ICD-10-CM | POA: Diagnosis not present

## 2022-06-28 DIAGNOSIS — Z923 Personal history of irradiation: Secondary | ICD-10-CM | POA: Diagnosis not present

## 2022-06-28 DIAGNOSIS — R69 Illness, unspecified: Secondary | ICD-10-CM | POA: Diagnosis not present

## 2022-06-28 DIAGNOSIS — R519 Headache, unspecified: Secondary | ICD-10-CM | POA: Diagnosis not present

## 2022-06-28 NOTE — Progress Notes (Signed)
Hematology and Oncology Follow Up Visit  Melanie Hogan [id number] November 14, 1970 51 y.o. 06/28/2022 2:29 PM Melanie Hogan, DOMatthews, Stearns, DO   Principle Diagnosis: 51 year old woman with stage IV melanoma noted in August 2023.  She presented with pulmonary involvement as well as CNS metastasis.  She initially presented with stage IIa in 2021.     Prior Therapy:   She is status post wide excision of the right upper back completed at [place] in July 2021.  She is status post whole brain radiation completed in September 2023 after receiving 30 Gray in 10 fractions.  Current therapy: Supportive care only.  Interim History: Melanie Hogan returns today for a follow-up visit.  She is a pleasant woman I saw in consultation in August 2023 after presenting with metastatic disease consistent with primary melanoma primary and multiple brain metastasis.  She completed whole brain radiation therapy with few residual complications including fatigue and tiredness.  She continues to be on steroids at nighttime taking 2 mg.  Her mobility is limited related to ALS in addition to her malignancy.  She is dependent on her family for full care but has no other complaints.  She denies any headaches or blurry vision.  She does use pain medication although very infrequently.  She has no other complaints at this time.     Medications: I have reviewed the patient's current medications.  Current Outpatient Medications  Medication Sig Dispense Refill   baclofen (LIORESAL) 10 MG tablet Take 10 mg by mouth 3 (three) times daily.     clotrimazole (MYCELEX) 10 MG troche Take 1 tablet (10 mg total) by mouth 5 (five) times daily. 70 Troche 1   dexamethasone (DECADRON) 2 MG tablet Take 1 tablet daily until your next follow up visit after MRI brain scan 30 tablet 0   diazepam (VALIUM) 5 MG tablet Take 1 tablet (5 mg total) by mouth 3 (three) times daily as needed for anxiety. 30 tablet 1   escitalopram (LEXAPRO) 20  MG tablet Take 1 tablet (20 mg total) by mouth at bedtime. 90 tablet 3   levETIRAcetam (KEPPRA) 500 MG tablet Take 1 tablet (500 mg total) by mouth 2 (two) times daily. 60 tablet 2   ondansetron (ZOFRAN) 8 MG tablet TAKE 1 TABLET(8 MG) BY MOUTH EVERY 8 HOURS AS NEEDED FOR NAUSEA OR VOMITING 30 tablet 2   oxyCODONE (OXY IR/ROXICODONE) 5 MG immediate release tablet Take 1 tablet (5 mg total) by mouth every 6 (six) hours as needed for severe pain. 30 tablet 0   pantoprazole (PROTONIX) 20 MG tablet Take 1 tablet (20 mg total) by mouth daily. 30 tablet 2   polyethylene glycol (MIRALAX) 17 g packet Take 17 g by mouth 2 (two) times daily. 14 each 0   senna (SENOKOT) 8.6 MG TABS tablet Take 1 tablet (8.6 mg total) by mouth daily. 120 tablet 0   sorbitol 70 % SOLN Take 30 mLs by mouth daily as needed for moderate constipation or severe constipation. 473 mL 1   traZODone (DESYREL) 150 MG tablet Take 1 tablet (150 mg total) by mouth at bedtime as needed. for sleep 90 tablet 3   No current facility-administered medications for this visit.     Allergies:  Allergies  Allergen Reactions   Morphine Other (See Comments), Itching and Nausea And Vomiting    Per pt was also violent  N/V, ineffective   Riluzole Nausea Only      Physical Exam:  ECOG: 3  General appearance: Comfortable appearing without any discomfort Head: Normocephalic without any trauma Oropharynx: Mucous membranes are moist and pink without any thrush or ulcers. Eyes: Pupils are equal and round reactive to light. Lymph nodes: No cervical, supraclavicular, inguinal or axillary lymphadenopathy.   Heart:regular rate and rhythm.  S1 and S2 without leg edema. Lung: Clear without any rhonchi or wheezes.  No dullness to percussion. Abdomin: Soft, nontender, nondistended with good bowel sounds.  No hepatosplenomegaly. Musculoskeletal: No joint deformity or effusion.  Full range of motion noted. Neurological: No deficits noted on  motor, sensory and deep tendon reflex exam. Skin: No petechial rash or dryness.  Appeared moist.     Lab Results: Lab Results  Component Value Date   WBC 9.0 05/06/2022   HGB 11.2 (L) 05/06/2022   HCT 33.0 (L) 05/06/2022   MCV 94.4 05/06/2022   PLT 316 05/06/2022     Chemistry      Component Value Date/Time   NA 142 05/06/2022 0110   K 3.8 05/06/2022 0110   CL 105 05/06/2022 0110   CO2 27 05/06/2022 0036   BUN 18 05/06/2022 0110   CREATININE 0.70 05/06/2022 0110   CREATININE 1.19 (H) 12/11/2019 1404      Component Value Date/Time   CALCIUM 8.8 (L) 05/06/2022 0036   ALKPHOS 76 05/06/2022 0036   AST 15 05/06/2022 0036   ALT 16 05/06/2022 0036   BILITOT 0.4 05/06/2022 0036          Impression and Plan:   51 year old  1.  Stage IV melanoma documented in August 2021 after presenting with stage IIa upper back lesion that was resected in 2021.  She has no pulmonary and CNS involvement at this time.  The natural course of this disease was discussed today with the patient and her family.  She is not a candidate for systemic therapy or any additional treatment.  She has deferred obtaining tissue biopsy as well as any aggressive measures.  I agree with this decision and given her overall poor prognosis and limited performance status I do not recommend any additional body imaging.  2.  CNS metastasis: She is currently managed by radiation oncology has an MRI scheduled in the future.  She is on tapering doses of dexamethasone.  3.  Pain management: She continues to follow with the palliative care team and currently her symptoms are well managed.  4.  Prognosis and goals of care: Her prognosis is poor with limited life expectancy.  She is currently thriving however and asymptomatic.  5.  Follow-up: No further oncology follow-up is needed.  If her condition declines in the future enrollment in hospice would be recommended.  30  minutes were dedicated to this visit. The time  was spent on reviewing laboratory data, imaging studies, discussing treatment options,  and answering questions regarding future plan.    Melanie Button, MD 11/21/20232:29 PM

## 2022-06-28 NOTE — Progress Notes (Signed)
Follow-up nursing interview. I verified patient's identity and began nursing interview. Patient reports fatigue, headaches, and RT sided RIB pain 7/10. No other issues reported at this time.  Meaningful use complete. No chances of pregnancy.  BP 112/76 (BP Location: Left Arm, Patient Position: Sitting, Cuff Size: Normal)   Pulse 74   Temp 98.7 F (37.1 C) (Oral)   Resp 14   Ht '5\' 6"'$  (1.676 m)   SpO2 100%   BMI 19.37 kg/m   This concludes the nursing interview.  Leandra Kern, LPN

## 2022-06-28 NOTE — Progress Notes (Signed)
[name]  [phone] [phone]   Name: Melanie Hogan Date: 06/28/2022 MRN: [id number]  DOB: 07-17-1971  Patient Care Team: [name], DO as PCP - General (Family Medicine) Pickenpack-Cousar, Carlena Sax, NP as Nurse Practitioner (Nurse Practitioner)    INTERVAL HISTORY: Melanie Hogan is a 51 y.o. female with oncologic medical history including ALS, anxiety, stageIIA melanoma s/p resection at [facility]. Recently admitted and found to have multiple round lesions and vasogenic edema, numerous pulmonary nodules concerning for metastatic disease s/p whole brain radiation. Palliative ask to see for symptom management and goals of care.    SOCIAL HISTORY:     reports that she has been smoking cigarettes. She has a 10.00 pack-year smoking history. She has never used smokeless tobacco. She reports that she does not currently use alcohol. She reports that she does not currently use drugs.  ADVANCE DIRECTIVES:  Patient does not have a completed advanced directive however she has documents on hand. She has completed and pending finalization. We will make referral to Social Work. Document reviewed at length.     CODE STATUS: DNR  PAST MEDICAL HISTORY: Past Medical History:  Diagnosis Date   ALS (amyotrophic lateral sclerosis) (HCC)    Anxiety    At high risk for falls    Depression    Migraines    Tobacco use disorder     ALLERGIES:  is allergic to morphine and riluzole.  MEDICATIONS:  Current Outpatient Medications  Medication Sig Dispense Refill   baclofen (LIORESAL) 10 MG tablet Take 10 mg by mouth 3 (three) times daily.     clotrimazole (MYCELEX) 10 MG troche Take 1 tablet (10 mg total) by mouth 5 (five) times daily. 70 Troche 1   dexamethasone (DECADRON) 2 MG tablet Take 1 tablet daily until your next follow up visit after MRI brain scan 30 tablet 0   diazepam (VALIUM) 5 MG tablet Take 1  tablet (5 mg total) by mouth 3 (three) times daily as needed for anxiety. 30 tablet 1   escitalopram (LEXAPRO) 20 MG tablet Take 1 tablet (20 mg total) by mouth at bedtime. 90 tablet 3   levETIRAcetam (KEPPRA) 500 MG tablet Take 1 tablet (500 mg total) by mouth 2 (two) times daily. 60 tablet 2   ondansetron (ZOFRAN) 8 MG tablet TAKE 1 TABLET(8 MG) BY MOUTH EVERY 8 HOURS AS NEEDED FOR NAUSEA OR VOMITING 30 tablet 2   oxyCODONE (OXY IR/ROXICODONE) 5 MG immediate release tablet Take 1 tablet (5 mg total) by mouth every 6 (six) hours as needed for severe pain. 30 tablet 0   pantoprazole (PROTONIX) 20 MG tablet Take 1 tablet (20 mg total) by mouth daily. 30 tablet 2   polyethylene glycol (MIRALAX) 17 g packet Take 17 g by mouth 2 (two) times daily. 14 each 0   senna (SENOKOT) 8.6 MG TABS tablet Take 1 tablet (8.6 mg total) by mouth daily. 120 tablet 0   sorbitol 70 % SOLN Take 30 mLs by mouth daily as needed for moderate constipation or severe constipation. 473 mL 1   traZODone (DESYREL) 150 MG tablet Take 1 tablet (150 mg total) by mouth at bedtime as needed. for sleep 90 tablet 3   No current facility-administered medications for this visit.    VITAL SIGNS: There were no vitals taken for this visit. There were no vitals filed for this visit.  Estimated body mass index is 19.37 kg/m as calculated  from the following:   Height as of an earlier encounter on 06/28/22: '5\' 6"'$  (1.676 m).   Weight as of 05/09/22: 120 lb (54.4 kg).   PERFORMANCE STATUS (ECOG) : 3 - Symptomatic, >50% confined to bed   Physical Exam General: NAD, wheelchair bound  Cardiovascular: regular rate and rhythm Pulmonary: normal breathing pattern  Extremities: no edema, no joint deformities Skin: no rashes Neurological: AAO x3, able to engage, slow response   IMPRESSION: Melanie Hogan presents to clinic today for follow-up. She was seen by Ailene Ards, PA today. No acute distress. Remaining as active and as functional as  possible.   Family reports a few days ago patient's blood pressure was lower than usual. No symptoms or defining factors. Since has been normal. Ongoing headaches. Some flank area pain. Symptoms controlled  under current regimen. We discussed medications at length. Does not require daily use of pain medication.   All questions answered and support provided.   I discussed the importance of continued conversation with family and their medical providers regarding overall plan of care and treatment options, ensuring decisions are within the context of the patients values and GOCs.  PLAN: Ongoing goals of care and symptom management support.  Oxycodone 5 mg every 6 hours as needed for pain (does not require daily)  ondansetron 8 mg as needed for nausea Valium 5 mg as needed for anxiety Sorbitol as needed for constipation Baclofen 3 times daily We reviewed at length her medication regimen.  She and family verbalized understanding. I will plan to see patient in 4-6 weeks virtually. Sooner if needed.    Patient expressed understanding and was in agreement with this plan. She also understands that She can call the clinic at any time with any questions, concerns, or complaints.   Any controlled substances utilized were prescribed in the context of palliative care. PDMP has been reviewed.   Time Total: 35 min   Visit consisted of counseling and education dealing with the complex and emotionally intense issues of symptom management and palliative care in the setting of serious and potentially life-threatening illness.Greater than 50%  of this time was spent counseling and coordinating care related to the above assessment and plan.  Alda Lea, AGPCNP-BC  Palliative Medicine Team/[place]

## 2022-06-29 ENCOUNTER — Inpatient Hospital Stay: Payer: Medicare HMO | Admitting: Oncology

## 2022-07-08 ENCOUNTER — Encounter: Payer: Self-pay | Admitting: Nurse Practitioner

## 2022-07-08 DIAGNOSIS — C7931 Secondary malignant neoplasm of brain: Secondary | ICD-10-CM

## 2022-07-08 MED ORDER — LEVETIRACETAM 500 MG PO TABS: 500.0000 mg | ORAL_TABLET | Freq: Two times a day (BID) | ORAL | 2 refills | Status: AC

## 2022-07-10 ENCOUNTER — Other Ambulatory Visit: Payer: Self-pay | Admitting: Urology

## 2022-07-10 DIAGNOSIS — K5900 Constipation, unspecified: Secondary | ICD-10-CM

## 2022-07-10 DIAGNOSIS — C7931 Secondary malignant neoplasm of brain: Secondary | ICD-10-CM

## 2022-07-10 DIAGNOSIS — Z515 Encounter for palliative care: Secondary | ICD-10-CM

## 2022-07-12 ENCOUNTER — Other Ambulatory Visit: Payer: Self-pay

## 2022-07-12 DIAGNOSIS — G893 Neoplasm related pain (acute) (chronic): Secondary | ICD-10-CM

## 2022-07-12 DIAGNOSIS — C7931 Secondary malignant neoplasm of brain: Secondary | ICD-10-CM

## 2022-07-12 MED ORDER — OXYCODONE HCL 5 MG PO TABS: 5.0000 mg | ORAL_TABLET | Freq: Four times a day (QID) | ORAL | 0 refills | Status: DC | PRN

## 2022-07-20 ENCOUNTER — Encounter (HOSPITAL_COMMUNITY): Payer: Self-pay | Admitting: *Deleted

## 2022-07-20 NOTE — Progress Notes (Signed)
LMOM with instructions for DOS.  Unable to reach patient via phone.  PCP - [name]. DO Cardiologist - n/a Neurology - Dr Jeneen Rinks CaressOncology - Dr Zola Button  Chest x-ray - 05/06/22 (1V) EKG - n/a Stress Test - n/a ECHO - n/a Cardiac Cath - n/a  ICD Pacemaker/Loop - n/a  Sleep Study -  n/a CPAP - none  Anesthesia review: Yes  STOP now taking any Aspirin (unless otherwise instructed by your surgeon), Aleve, Naproxen, Ibuprofen, Motrin, Advil, Goody's, BC's, all herbal medications, fish oil, and all vitamins.

## 2022-07-21 ENCOUNTER — Ambulatory Visit (HOSPITAL_BASED_OUTPATIENT_CLINIC_OR_DEPARTMENT_OTHER): Payer: Medicare HMO | Admitting: Physician Assistant

## 2022-07-21 ENCOUNTER — Encounter (HOSPITAL_COMMUNITY)
Admission: RE | Disposition: A | Discharge status: Home / Self Care | Payer: Self-pay | Source: Home / Self Care | Attending: Radiation Oncology

## 2022-07-21 ENCOUNTER — Ambulatory Visit (HOSPITAL_COMMUNITY)
Admission: RE | Admit: 2022-07-21 | Discharge: 2022-07-21 | Disposition: A | Discharge status: Home / Self Care | Payer: Medicare HMO | Attending: Radiation Oncology | Admitting: Radiation Oncology

## 2022-07-21 ENCOUNTER — Encounter (HOSPITAL_COMMUNITY): Payer: Self-pay | Admitting: Certified Registered Nurse Anesthetist

## 2022-07-21 ENCOUNTER — Ambulatory Visit (HOSPITAL_COMMUNITY): Payer: Medicare HMO | Admitting: Physician Assistant

## 2022-07-21 ENCOUNTER — Ambulatory Visit (HOSPITAL_COMMUNITY)
Admission: RE | Admit: 2022-07-21 | Discharge: 2022-07-21 | Disposition: A | Discharge status: Home / Self Care | Payer: Medicare HMO | Source: Ambulatory Visit | Attending: Radiation Oncology | Admitting: Radiation Oncology

## 2022-07-21 DIAGNOSIS — G939 Disorder of brain, unspecified: Secondary | ICD-10-CM | POA: Diagnosis not present

## 2022-07-21 DIAGNOSIS — R519 Headache, unspecified: Secondary | ICD-10-CM | POA: Insufficient documentation

## 2022-07-21 DIAGNOSIS — F17210 Nicotine dependence, cigarettes, uncomplicated: Secondary | ICD-10-CM

## 2022-07-21 DIAGNOSIS — N289 Disorder of kidney and ureter, unspecified: Secondary | ICD-10-CM | POA: Diagnosis not present

## 2022-07-21 DIAGNOSIS — R69 Illness, unspecified: Secondary | ICD-10-CM | POA: Diagnosis not present

## 2022-07-21 DIAGNOSIS — C7931 Secondary malignant neoplasm of brain: Secondary | ICD-10-CM

## 2022-07-21 DIAGNOSIS — G9389 Other specified disorders of brain: Secondary | ICD-10-CM | POA: Diagnosis not present

## 2022-07-21 DIAGNOSIS — C801 Malignant (primary) neoplasm, unspecified: Secondary | ICD-10-CM | POA: Diagnosis not present

## 2022-07-21 DIAGNOSIS — G936 Cerebral edema: Secondary | ICD-10-CM | POA: Diagnosis not present

## 2022-07-21 DIAGNOSIS — C4359 Malignant melanoma of other part of trunk: Secondary | ICD-10-CM | POA: Insufficient documentation

## 2022-07-21 DIAGNOSIS — F17200 Nicotine dependence, unspecified, uncomplicated: Secondary | ICD-10-CM | POA: Diagnosis not present

## 2022-07-21 HISTORY — PX: RADIOLOGY WITH ANESTHESIA: SHX6223

## 2022-07-21 LAB — POCT I-STAT, CHEM 8
BUN: 22 mg/dL — ABNORMAL HIGH (ref 6–20)
BUN: 22 mg/dL — ABNORMAL HIGH (ref 6–20)
BUN: 22 mg/dL — ABNORMAL HIGH (ref 6–20)
Calcium, Ion: 1.06 mmol/L — ABNORMAL LOW (ref 1.15–1.40)
Calcium, Ion: 1.06 mmol/L — ABNORMAL LOW (ref 1.15–1.40)
Calcium, Ion: 1.06 mmol/L — ABNORMAL LOW (ref 1.15–1.40)
Chloride: 107 mmol/L (ref 98–111)
Chloride: 107 mmol/L (ref 98–111)
Chloride: 107 mmol/L (ref 98–111)
Creatinine, Ser: 0.7 mg/dL (ref 0.44–1.00)
Creatinine, Ser: 0.7 mg/dL (ref 0.44–1.00)
Creatinine, Ser: 0.7 mg/dL (ref 0.44–1.00)
Glucose, Bld: 84 mg/dL (ref 70–99)
Glucose, Bld: 84 mg/dL (ref 70–99)
Glucose, Bld: 84 mg/dL (ref 70–99)
HCT: 40 % (ref 36.0–46.0)
HCT: 40 % (ref 36.0–46.0)
HCT: 40 % (ref 36.0–46.0)
Hemoglobin: 13.6 g/dL (ref 12.0–15.0)
Hemoglobin: 13.6 g/dL (ref 12.0–15.0)
Hemoglobin: 13.6 g/dL (ref 12.0–15.0)
Potassium: 4.2 mmol/L (ref 3.5–5.1)
Potassium: 4.2 mmol/L (ref 3.5–5.1)
Potassium: 4.2 mmol/L (ref 3.5–5.1)
Sodium: 140 mmol/L (ref 135–145)
Sodium: 140 mmol/L (ref 135–145)
Sodium: 140 mmol/L (ref 135–145)
TCO2: 24 mmol/L (ref 22–32)
TCO2: 24 mmol/L (ref 22–32)
TCO2: 24 mmol/L (ref 22–32)

## 2022-07-21 SURGERY — MRI WITH ANESTHESIA
Anesthesia: General

## 2022-07-21 MED ORDER — PROPOFOL 10 MG/ML IV BOLUS
INTRAVENOUS | Status: DC | PRN
  Administered 2022-07-21: 140 mg via INTRAVENOUS

## 2022-07-21 MED ORDER — LACTATED RINGERS IV SOLN: INTRAVENOUS | Status: DC

## 2022-07-21 MED ORDER — PHENYLEPHRINE HCL-NACL 20-0.9 MG/250ML-% IV SOLN
INTRAVENOUS | Status: DC | PRN
  Administered 2022-07-21: 25 ug/min via INTRAVENOUS

## 2022-07-21 MED ORDER — FENTANYL CITRATE (PF) 100 MCG/2ML IJ SOLN
INTRAMUSCULAR | Status: DC | PRN
  Administered 2022-07-21 (×2): 50 ug via INTRAVENOUS

## 2022-07-21 MED ORDER — CHLORHEXIDINE GLUCONATE 0.12 % MT SOLN
15.0000 mL | OROMUCOSAL | Status: DC
  Filled 2022-07-21: qty 15

## 2022-07-21 MED ORDER — GADOBUTROL 1 MMOL/ML IV SOLN
5.0000 mL | Freq: Once | INTRAVENOUS | Status: AC | PRN
  Administered 2022-07-21: 5 mL via INTRAVENOUS

## 2022-07-21 MED ORDER — ROCURONIUM BROMIDE 10 MG/ML (PF) SYRINGE
PREFILLED_SYRINGE | INTRAVENOUS | Status: DC | PRN
  Administered 2022-07-21: 40 mg via INTRAVENOUS

## 2022-07-21 MED ORDER — LIDOCAINE 2% (20 MG/ML) 5 ML SYRINGE
INTRAMUSCULAR | Status: DC | PRN
  Administered 2022-07-21: 80 mg via INTRAVENOUS

## 2022-07-21 MED ORDER — ONDANSETRON HCL 4 MG/2ML IJ SOLN
INTRAMUSCULAR | Status: DC | PRN
  Administered 2022-07-21: 4 mg via INTRAVENOUS

## 2022-07-21 MED ORDER — PHENYLEPHRINE 80 MCG/ML (10ML) SYRINGE FOR IV PUSH (FOR BLOOD PRESSURE SUPPORT)
PREFILLED_SYRINGE | INTRAVENOUS | Status: DC | PRN
  Administered 2022-07-21 (×2): 80 ug via INTRAVENOUS

## 2022-07-21 MED ORDER — DEXAMETHASONE SODIUM PHOSPHATE 10 MG/ML IJ SOLN
INTRAMUSCULAR | Status: DC | PRN
  Administered 2022-07-21: 10 mg via INTRAVENOUS

## 2022-07-21 MED ORDER — SUGAMMADEX SODIUM 200 MG/2ML IV SOLN
INTRAVENOUS | Status: DC | PRN
  Administered 2022-07-21: 200 mg via INTRAVENOUS

## 2022-07-21 MED ORDER — SUCCINYLCHOLINE CHLORIDE 200 MG/10ML IV SOSY
PREFILLED_SYRINGE | INTRAVENOUS | Status: DC | PRN
  Administered 2022-07-21: 100 mg via INTRAVENOUS

## 2022-07-21 NOTE — Anesthesia Procedure Notes (Signed)
Procedure Name: Intubation Date/Time: 07/21/2022 11:41 AM  Performed by: Harden Mo, CRNAPre-anesthesia Checklist: Patient identified, Emergency Drugs available, Suction available and Patient being monitored Patient Re-evaluated:Patient Re-evaluated prior to induction Oxygen Delivery Method: Circle System Utilized Preoxygenation: Pre-oxygenation with 100% oxygen Induction Type: IV induction Ventilation: Mask ventilation without difficulty Laryngoscope Size: Miller and 2 Grade View: Grade I Tube type: Oral Tube size: 7.0 mm Number of attempts: 1 Airway Equipment and Method: Stylet and Oral airway Placement Confirmation: ETT inserted through vocal cords under direct vision, positive ETCO2 and breath sounds checked- equal and bilateral Secured at: 22 cm Tube secured with: Tape Dental Injury: Teeth and Oropharynx as per pre-operative assessment

## 2022-07-21 NOTE — Anesthesia Postprocedure Evaluation (Signed)
Anesthesia Post Note  Patient: Melanie Hogan  Procedure(s) Performed: MRI WITH BRAIN WITH AND WITHOUT CONTRAST     Patient location during evaluation: PACU Anesthesia Type: General Pain management: pain level controlled Respiratory status: spontaneous breathing Cardiovascular status: stable Postop Assessment: no apparent nausea or vomiting Anesthetic complications: no   No notable events documented.  Last Vitals:  Vitals:   07/21/22 1330 07/21/22 1345  BP: 101/67 104/66  Pulse: 67 64  Resp: 11 13  Temp:  37.4 C  SpO2: 91% 93%    Last Pain:  Vitals:   07/21/22 1322  TempSrc:   PainSc: 0-No pain                 Shannon Kirkendall

## 2022-07-21 NOTE — Anesthesia Preprocedure Evaluation (Addendum)
Anesthesia Evaluation  Patient identified by MRN, date of birth, ID band Patient awake  General Assessment Comment:History noted Dr. Nyoka Cowden  Reviewed: Allergy & Precautions, NPO status , Patient's Chart, lab work & pertinent test results  History of Anesthesia Complications (+) POST - OP SPINAL HEADACHE and history of anesthetic complications  Airway Mallampati: II       Dental   Pulmonary Current Smoker and Patient abstained from smoking.   breath sounds clear to auscultation       Cardiovascular negative cardio ROS  Rhythm:Regular Rate:Normal     Neuro/Psych  Headaches PSYCHIATRIC DISORDERS      History noted Dr. Nyoka Cowden    GI/Hepatic Neg liver ROS,,,  Endo/Other    Renal/GU Renal disease     Musculoskeletal   Abdominal   Peds  Hematology   Anesthesia Other Findings   Reproductive/Obstetrics                             Anesthesia Physical Anesthesia Plan  ASA: 3  Anesthesia Plan: General   Post-op Pain Management: Tylenol PO (pre-op)*   Induction: Intravenous  PONV Risk Score and Plan: 2 and Propofol infusion, Ondansetron, Midazolam and Treatment may vary due to age or medical condition  Airway Management Planned: Oral ETT  Additional Equipment:   Intra-op Plan:   Post-operative Plan: Extubation in OR  Informed Consent: I have reviewed the patients History and Physical, chart, labs and discussed the procedure including the risks, benefits and alternatives for the proposed anesthesia with the patient or authorized representative who has indicated his/her understanding and acceptance.     Dental advisory given  Plan Discussed with: CRNA and Anesthesiologist  Anesthesia Plan Comments:        Anesthesia Quick Evaluation

## 2022-07-21 NOTE — Transfer of Care (Signed)
Immediate Anesthesia Transfer of Care Note  Patient: Melanie Hogan  Procedure(s) Performed: MRI WITH BRAIN WITH AND WITHOUT CONTRAST  Patient Location: PACU  Anesthesia Type:General  Level of Consciousness: awake, alert , and oriented  Airway & Oxygen Therapy: Patient Spontanous Breathing  Post-op Assessment: Report given to RN, Post -op Vital signs reviewed and stable, and Patient moving all extremities X 4  Post vital signs: Reviewed and stable  Last Vitals:  Vitals Value Taken Time  BP 109/65 07/21/22 1322  Temp 37.4 C 07/21/22 1322  Pulse 66 07/21/22 1327  Resp 12 07/21/22 1327  SpO2 92 % 07/21/22 1327  Vitals shown include unvalidated device data.  Last Pain:  Vitals:   07/21/22 1322  TempSrc:   PainSc: 0-No pain      Patients Stated Pain Goal: 0 (79/89/21 1941)  Complications: No notable events documented.

## 2022-07-22 ENCOUNTER — Encounter (HOSPITAL_COMMUNITY): Payer: Self-pay | Admitting: Radiology

## 2022-07-25 ENCOUNTER — Inpatient Hospital Stay: Payer: Medicare HMO | Attending: Radiation Oncology

## 2022-07-27 ENCOUNTER — Ambulatory Visit
Admission: RE | Admit: 2022-07-27 | Discharge: 2022-07-27 | Disposition: A | Discharge status: Home / Self Care | Payer: Medicare HMO | Source: Ambulatory Visit | Attending: Urology | Admitting: Urology

## 2022-07-27 ENCOUNTER — Encounter: Payer: Self-pay | Admitting: Urology

## 2022-07-27 DIAGNOSIS — C439 Malignant melanoma of skin, unspecified: Secondary | ICD-10-CM | POA: Diagnosis not present

## 2022-07-27 DIAGNOSIS — C7931 Secondary malignant neoplasm of brain: Secondary | ICD-10-CM

## 2022-07-27 NOTE — Progress Notes (Signed)
Radiation Oncology         [phone] ________________________________  Name: Melanie Hogan MRN: [id number]  Date: 07/27/2022  DOB: 04-20-1971  Post Treatment Note  CC: [name], DO  Lequita Halt, MD  Diagnosis:   51 yo woman with at least 87 brain metastases from melanoma       Interval Since Last Radiation:  3 months  04/06/22 - 04/20/22: The whole brain was treated to 30 Gy in 10 fractions of 3 Gy  Narrative:  I spoke with the patient and her husband to conduct her routine scheduled 3 month follow up visit to review the results of her recent post-treatment MRI brain scan via telephone to spare the patient unnecessary potential exposure in the healthcare setting during the current COVID-19 pandemic.  The patient was notified in advance and gave permission to proceed with this visit format. She  tolerated radiation treatment relatively well with modest fatigue but no N/V or headaches.                               On review of systems, the patient states that she is doing well in general and denies recent headaches, visual changes, changes in balance, or N/V. She continues with fatigue and decreased appetite but this has remained stable over the past several weeks. Otherwise, she is doing well and denies chest pain, increased shortness of breath, productive cough, hemoptysis, fever, chills or night sweats.  She has continued taking '2mg'$  Decadron daily and overall, she is pleased with her progress to date.  ALLERGIES:  is allergic to morphine and riluzole.  Meds: Current Outpatient Medications  Medication Sig Dispense Refill   baclofen (LIORESAL) 10 MG tablet Take 10 mg by mouth 3 (three) times daily.     clotrimazole (MYCELEX) 10 MG troche Take 1 tablet (10 mg total) by mouth 5 (five) times daily. 70 Troche 1   dexamethasone (DECADRON) 2 MG tablet TAKE 1 TABLET BY MOUTH DAILY UNTIL YOUR NEXT FOLLOW UP VISIT AFTER MRI BRAIN SCAN. 30 tablet 0   diazepam (VALIUM) 5 MG tablet  Take 1 tablet (5 mg total) by mouth 3 (three) times daily as needed for anxiety. 30 tablet 1   escitalopram (LEXAPRO) 20 MG tablet Take 1 tablet (20 mg total) by mouth at bedtime. 90 tablet 3   levETIRAcetam (KEPPRA) 500 MG tablet Take 1 tablet (500 mg total) by mouth 2 (two) times daily. 60 tablet 2   ondansetron (ZOFRAN) 8 MG tablet TAKE 1 TABLET(8 MG) BY MOUTH EVERY 8 HOURS AS NEEDED FOR NAUSEA OR VOMITING 30 tablet 2   oxyCODONE (OXY IR/ROXICODONE) 5 MG immediate release tablet Take 1 tablet (5 mg total) by mouth every 6 (six) hours as needed for severe pain. 30 tablet 0   pantoprazole (PROTONIX) 20 MG tablet Take 1 tablet (20 mg total) by mouth daily. 30 tablet 2   polyethylene glycol (MIRALAX) 17 g packet Take 17 g by mouth 2 (two) times daily. 14 each 0   senna (SENOKOT) 8.6 MG TABS tablet Take 1 tablet (8.6 mg total) by mouth daily. 120 tablet 0   sorbitol 70 % SOLN Take 30 mLs by mouth daily as needed for moderate constipation or severe constipation. 473 mL 1   traZODone (DESYREL) 150 MG tablet Take 1 tablet (150 mg total) by mouth at bedtime as needed. for sleep 90 tablet 3   No current facility-administered medications for  this encounter.    Physical Findings:  vitals were not taken for this visit.   /10 Unable to assess due to telephone follow up visit format.  Lab Findings: Lab Results  Component Value Date   WBC 9.0 05/06/2022   HGB 13.6 07/21/2022   HGB 13.6 07/21/2022   HGB 13.6 07/21/2022   HCT 40.0 07/21/2022   HCT 40.0 07/21/2022   HCT 40.0 07/21/2022   MCV 94.4 05/06/2022   PLT 316 05/06/2022     Radiographic Findings: MR Brain W Wo Contrast  Result Date: 07/21/2022 CLINICAL DATA:  Brain metastases. EXAM: MRI HEAD WITHOUT AND WITH CONTRAST TECHNIQUE: Multiplanar, multiecho pulse sequences of the brain and surrounding structures were obtained without and with intravenous contrast. CONTRAST:  50m GADAVIST GADOBUTROL 1 MMOL/ML IV SOLN COMPARISON:  Brain MRI  04/04/2022 FINDINGS: Brain: Numerous metastatic lesions are again seen throughout the brain. The largest lesions are as follows: *1.9 cm x 1.3 cm lesion in the right parietal lobe with intrinsic T1 hyperintensity and intralesional blood products, decreased in size from 1.9 cm by 1.9 cm. FLAIR signal abnormality surrounding this lesion has significantly decreased. *1.3 cm by 1.0 cm lesion in the medial left occipital lobe, decreased in size from 1.7 cm x 1.2 cm. Surrounding FLAIR signal abnormality is slightly decreased. This lesion may be extra-axial. *2.0 cm x 1.7 cm lesion in the right anterior temporal lobe, decreased in size from 2.6 cm x 2.3 cm. The additional lesion more anteriorly in the temporal lobe measuring 1.1 cm x 0.9 cm is also decreased in size from 1.3 cm x 1.4 cm (1300-163, 162). Vasogenic edema in the temporal lobe has decreased, with re-expansion of the right lateral ventricle and resolved midline shift. *0.8 cm extra-axial lesion overlying the right temporal lobe laterally is slightly decreased in size from 1.0 cm (1300-210). *A few punctate cerebellar lesions appear stable. *Numerous additional smaller lesions throughout the brain are overall stable or slightly decreased in size. There is no acute intracranial hemorrhage, extra-axial fluid collection, or acute infarct. Parenchymal volume is stable. The ventricles are normal in size, with re-expansion of the right lateral ventricle as above. There is no midline shift. Vascular: Normal flow voids. Skull and upper cervical spine: Normal marrow signal. Sinuses/Orbits: There is mucosal thickening in the right sphenoid sinus. Layering fluid in the imaged nasopharynx is likely related to intubation. The globes and orbits are unremarkable. Other: None. IMPRESSION: Numerous metastatic lesions throughout the brain as above. The largest lesions in the right anterior temporal lobe, right parietal lobe, and left occipital lobe are decreased in size, with  decreased surrounding edema, re-expansion of the right lateral ventricle and resolved midline shift. The numerous smaller lesions appear stable or decreased in size. No definite new lesions. Electronically Signed   By: PValetta MoleM.D.   On: 07/21/2022 14:01    Impression/Plan: 1. 51yo woman with at least 29 brain metastases from melanoma. She appears to have recovered well from the effects of her whole brain radiation.  She has continued taking the 2 mg Decadron nightly which has  controlled her headaches and seems to help with her appetite but she is interested in trying to come off the steroids If possible. We discussed cutting back to '2mg'$  of Decadron every other day for 2-3 weeks and then discontinuing but since she is not having any adverse side effects from the low dose steroids, it might be preferable to stay on the daily '2mg'$  dose for appetite stimulation if  nothing else.  They will discuss this further and make a decision but know that if she decides that she wants to come off the steroids but becomes symptomatic thereafter, she can always resume taking the 2 mg low daily dose. They will keep up updated regarding any recurrent or new symptoms.  She is not interested in pursuing systemic/chemotherapy based on her recent discussion with Dr. Alen Blew on 06/28/22 so the recommendation was to discontinue any follow-up imaging unless she becomes symptomatic.  Regarding the brain disease, we discussed the option to proceed with serial MRI brain imaging every 3 months to monitor for any evidence of disease progression or recurrence vs only obtaining brain imaging if she becomes symptomatic since she is not on any active treatment currently, with the understanding that if they opt to forego surveillance imaging, we might miss the opportunity to treat new lesions with less involved treatment when they are very small. They confirm their understanding of this and would like to go ahead and plan for repeat  surveillance imaging in 3 months with the understanding that they will still have the choice to proceed or decline at that time. She requires sedation for the MRI scans, so she would need an updated H&P prior to the scan, requiring an in-person follow up visit here at the cancer center which is incredibly difficult for her due to her ALS which creates a challenge getting to and from office visits which may ultimately be the reason they decline further surveillance imaging.  If she does proceed with surveillance imaging, I will plan to follow up with them by telephone following each scan to review results and recommendations from the multidisciplinary brain conference. She will also continue in routine follow-up with the palliative care team to discuss future enrollment in hospice care.  She and her husband know that they are welcome to call at anytime in the interim with any questions or concerns related to her previous radiation and/or any neurologic symptoms.      I personally spent 20 minutes in this encounter including chart review, reviewing radiological studies, telephone conversation with the patient, entering orders and completing documentation.     Nicholos Johns, PA-C

## 2022-07-27 NOTE — Progress Notes (Signed)
Telephone nursing appointment for Melanie Hogan to receive most recent MRI results from 07/21/22 per Ashlyn Bruning PA-C. I verified Melanie Hogan's identity and began nursing interview. Melanie Hogan has read MRI results on her "My Chart" and is not happy with them. Melanie Hogan would like to keep the results review as brief as possible. Melanie Hogan reports chest pain 2/10, SOB, fatigue, and extreme depression (do not mention depression).   Meaningful use complete. Hysterectomy- NO chances of pregnancy.   Melanie Hogan aware of their 9:00am-07/27/2022 telephone appointment w/ Ashlyn Bruning PA-C. I left my extension [phone] in case Melanie Hogan needs anything. Melanie Hogan verbalized understanding. This concludes the nursing interview.   Melanie Hogan contact [phone]     Leandra Kern, LPN

## 2022-08-09 IMAGING — CT CT SHOULDER*L* W/O CM
1 of 2 series · 9 of 14 positions shown, 12 images · non-contrast
Comparison: X-ray 04/02/2020

CLINICAL DATA: Left shoulder pain with limited range of motion.
Abnormal x-ray.

EXAM:
CT OF THE UPPER LEFT EXTREMITY WITHOUT CONTRAST
TECHNIQUE: Multidetector CT imaging of the upper left extremity was performed
according to the standard protocol.

[Series 5: thin soft · axial · 0.44mm/px · z∈[-267,-89]mm · 9 of 372 slices shown, 12 images]
[im 38/372  soft-tissue]
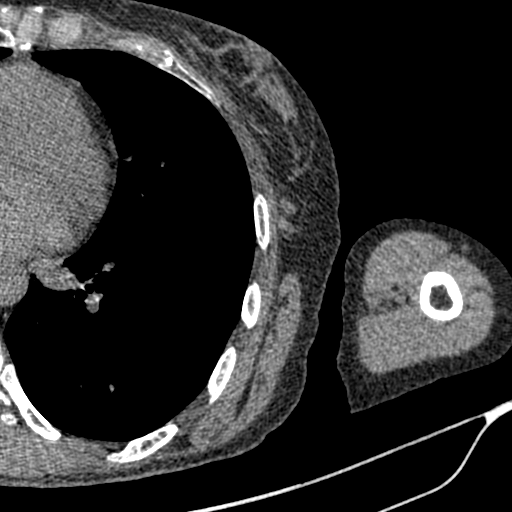
[im 38/372  bone]
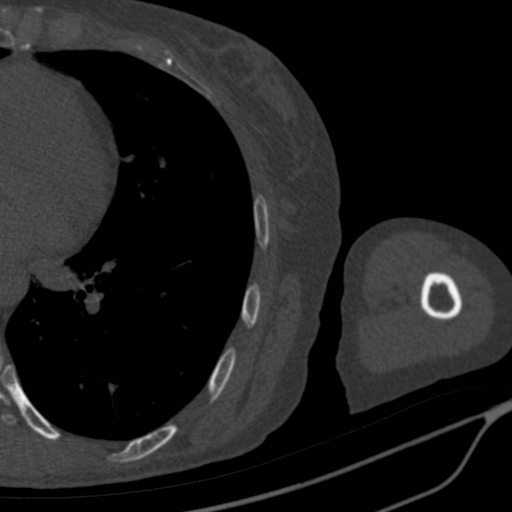
[im 75/372  bone]
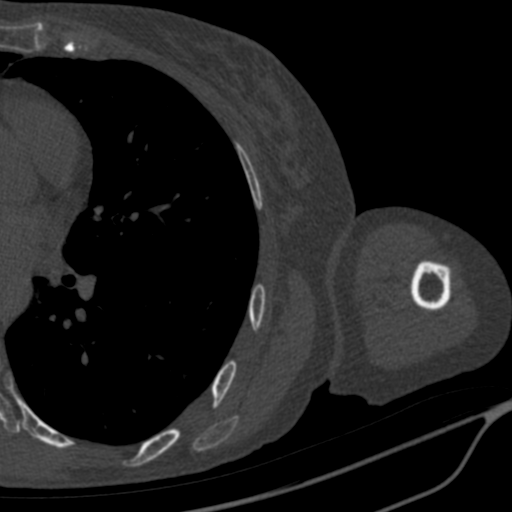
[im 112/372  bone]
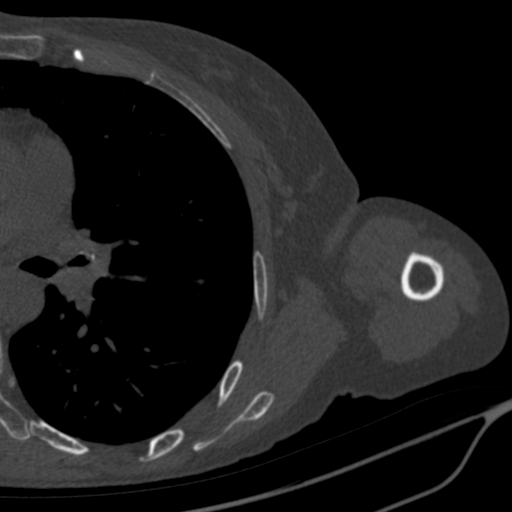
[im 149/372  bone]
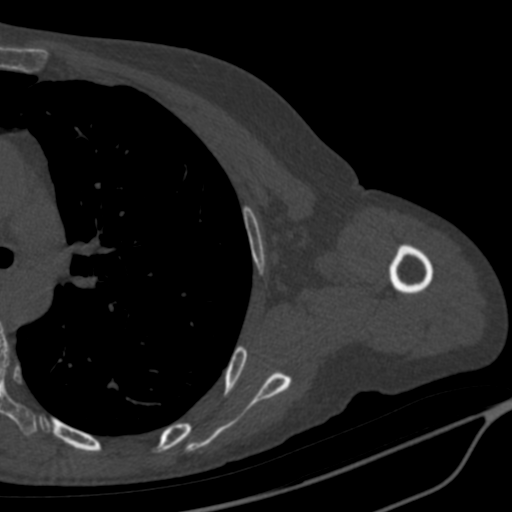
[im 186/372  soft-tissue]
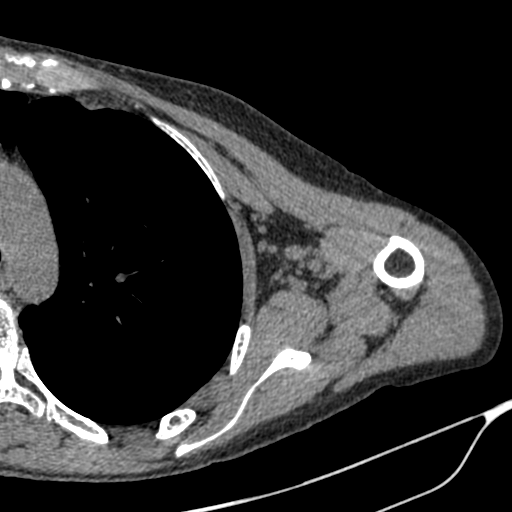
[im 186/372  bone]
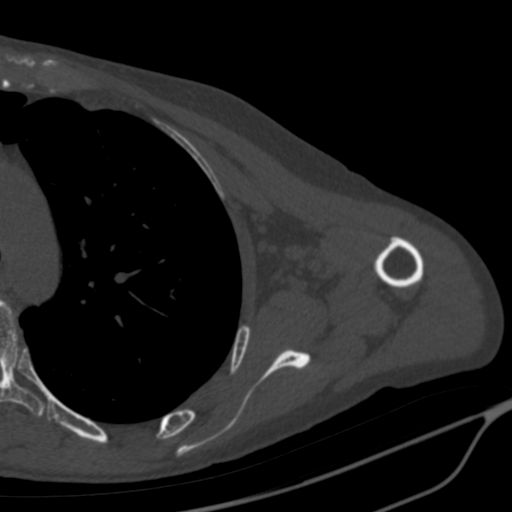
[im 223/372  bone]
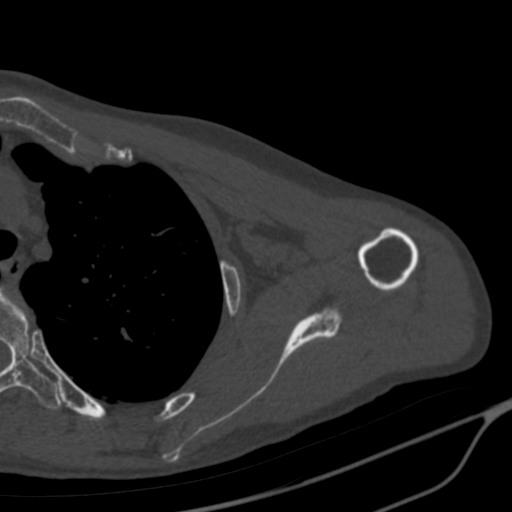
[im 260/372  bone]
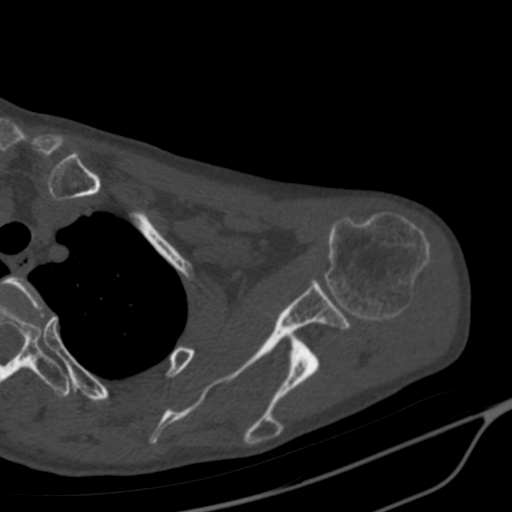
[im 297/372  bone]
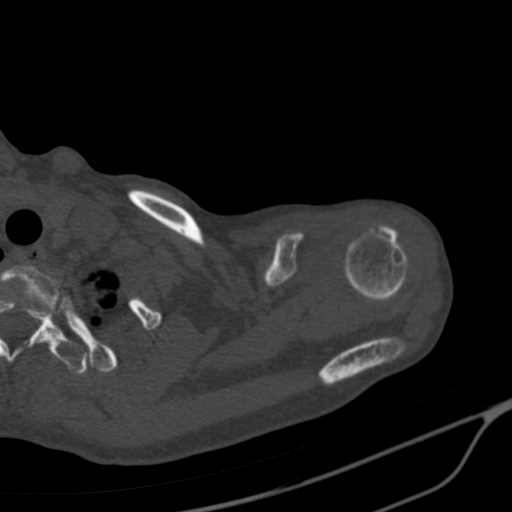
[im 334/372  soft-tissue]
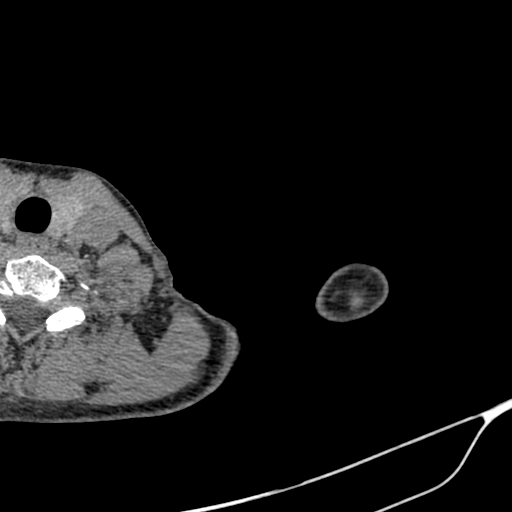
[im 334/372  bone]
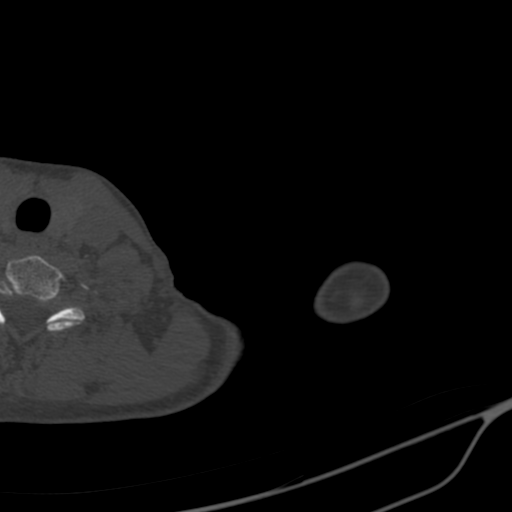

[9 of 14 positions shown; findings below may reference images not displayed]

FINDINGS: Bones/Joint/Cartilage

Subacute nondisplaced fracture involving the greater tuberosity of
the proximal left humerus (series 6, images 40-50). No definite
bridging bone formation across the fracture margins. No additional
fracture lines are evident. Glenohumeral joint is intact without
dislocation. Mild glenohumeral joint space narrowing. No joint
effusion is seen. Intact AC joint with mild arthropathy. No
appreciable subacromial-subdeltoid bursal fluid collection. No
definite CT correlate for the previously described area of lucency
within the left humeral metaphysis on prior X ray. There are no
suspicious lytic or sclerotic bone lesions.

Ligaments

Suboptimally assessed by CT.

Muscles and Tendons

Preserved bulk of the rotator cuff musculature without atrophy or
fatty infiltration. Grossly intact rotator cuff tendons within the
limitations of CT.

Soft tissues

No soft tissue swelling or hematoma. No left axillary
lymphadenopathy. Visualized portion of the left lung field is clear.
Mild apical pleuroparenchymal scarring.
IMPRESSION: 1. Subacute nondisplaced fracture involving the greater tuberosity
of the proximal left humerus. No bridging bone formation across the
fracture margins.
2. Mild glenohumeral and AC joint arthropathy.
3. No definite CT correlate for the previously described area of
lucency within the left humeral metaphysis on prior X-ray. There are
no suspicious lytic or sclerotic bone lesions.

## 2022-08-11 ENCOUNTER — Inpatient Hospital Stay: Payer: Medicare HMO | Admitting: Nurse Practitioner

## 2022-08-12 ENCOUNTER — Other Ambulatory Visit: Payer: Self-pay

## 2022-08-12 DIAGNOSIS — Z515 Encounter for palliative care: Secondary | ICD-10-CM

## 2022-08-12 DIAGNOSIS — K5900 Constipation, unspecified: Secondary | ICD-10-CM

## 2022-08-12 DIAGNOSIS — C7931 Secondary malignant neoplasm of brain: Secondary | ICD-10-CM

## 2022-08-12 MED ORDER — ONDANSETRON HCL 8 MG PO TABS: ORAL_TABLET | ORAL | 2 refills | Status: DC

## 2022-08-12 MED ORDER — DEXAMETHASONE 2 MG PO TABS: ORAL_TABLET | ORAL | 0 refills | Status: DC

## 2022-08-15 ENCOUNTER — Encounter: Payer: Self-pay | Admitting: Nurse Practitioner

## 2022-08-15 ENCOUNTER — Inpatient Hospital Stay: Payer: Medicare HMO | Attending: Radiation Oncology | Admitting: Nurse Practitioner

## 2022-08-15 DIAGNOSIS — G893 Neoplasm related pain (acute) (chronic): Secondary | ICD-10-CM | POA: Diagnosis not present

## 2022-08-15 DIAGNOSIS — Z515 Encounter for palliative care: Secondary | ICD-10-CM

## 2022-08-15 DIAGNOSIS — M792 Neuralgia and neuritis, unspecified: Secondary | ICD-10-CM | POA: Diagnosis not present

## 2022-08-15 DIAGNOSIS — R059 Cough, unspecified: Secondary | ICD-10-CM

## 2022-08-15 DIAGNOSIS — R530 Neoplastic (malignant) related fatigue: Secondary | ICD-10-CM | POA: Diagnosis not present

## 2022-08-15 NOTE — Progress Notes (Signed)
[place]  Telephone:([phone] Fax:(336) [phone]   Name: Melanie Hogan Date: 08/15/2022 MRN: [id number]  DOB: May 23, 1971  Patient Care Team: [name], DO as PCP - General (Family Medicine) Pickenpack-Cousar, Carlena Sax, NP as Nurse Practitioner (Nurse Practitioner)   I connected with Melanie Hogan on 08/15/22 at 11:00 AM EST by phone and verified that I am speaking with the correct person using two identifiers.   I discussed the limitations, risks, security and privacy concerns of performing an evaluation and management service by telemedicine and the availability of in-person appointments. I also discussed with the patient that there may be a patient responsible charge related to this service. The patient expressed understanding and agreed to proceed.   Other persons participating in the visit and their role in the encounter: Fritz Pickerel (brother)    Patient's location: home  Provider's location: [facility] office   Chief Complaint: Symptom Management Follow-Up   INTERVAL HISTORY: Melanie Hogan is a 52 y.o. female with oncologic medical history including ALS, anxiety, stageIIA melanoma s/p resection at [place]. Recently admitted and found to have multiple round lesions and vasogenic edema, numerous pulmonary nodules concerning for metastatic disease s/p whole brain radiation. Palliative ask to see for symptom management and goals of care.    SOCIAL HISTORY:     reports that she has been smoking cigarettes. She has a 10.00 pack-year smoking history. She has never used smokeless tobacco. She reports that she does not currently use alcohol. She reports that she does not currently use drugs.  ADVANCE DIRECTIVES:  Patient does not have a completed advanced directive however she has documents on hand. She has completed and pending finalization. We will make referral to Social Work. Document reviewed at length.     CODE  STATUS: DNR  PAST MEDICAL HISTORY: Past Medical History:  Diagnosis Date   ALS (amyotrophic lateral sclerosis) (HCC)    Anxiety    At high risk for falls    Depression    Migraines    Tobacco use disorder     ALLERGIES:  is allergic to morphine and riluzole.  MEDICATIONS:  Current Outpatient Medications  Medication Sig Dispense Refill   baclofen (LIORESAL) 10 MG tablet Take 10 mg by mouth 3 (three) times daily.     clotrimazole (MYCELEX) 10 MG troche Take 1 tablet (10 mg total) by mouth 5 (five) times daily. 70 Troche 1   dexamethasone (DECADRON) 2 MG tablet TAKE 1 TABLET BY MOUTH DAILY UNTIL YOUR NEXT FOLLOW UP VISIT AFTER MRI BRAIN SCAN. 30 tablet 0   diazepam (VALIUM) 5 MG tablet Take 1 tablet (5 mg total) by mouth 3 (three) times daily as needed for anxiety. 30 tablet 1   escitalopram (LEXAPRO) 20 MG tablet Take 1 tablet (20 mg total) by mouth at bedtime. 90 tablet 3   levETIRAcetam (KEPPRA) 500 MG tablet Take 1 tablet (500 mg total) by mouth 2 (two) times daily. 60 tablet 2   ondansetron (ZOFRAN) 8 MG tablet TAKE 1 TABLET(8 MG) BY MOUTH EVERY 8 HOURS AS NEEDED FOR NAUSEA OR VOMITING 30 tablet 2   oxyCODONE (OXY IR/ROXICODONE) 5 MG immediate release tablet Take 1 tablet (5 mg total) by mouth every 6 (six) hours as needed for severe pain. 30 tablet 0   pantoprazole (PROTONIX) 20 MG tablet Take 1 tablet (20 mg total) by mouth daily. 30 tablet 2   polyethylene glycol (MIRALAX) 17 g packet Take 17 g  by mouth 2 (two) times daily. 14 each 0   senna (SENOKOT) 8.6 MG TABS tablet Take 1 tablet (8.6 mg total) by mouth daily. 120 tablet 0   sorbitol 70 % SOLN Take 30 mLs by mouth daily as needed for moderate constipation or severe constipation. 473 mL 1   traZODone (DESYREL) 150 MG tablet Take 1 tablet (150 mg total) by mouth at bedtime as needed. for sleep 90 tablet 3   No current facility-administered medications for this visit.    VITAL SIGNS: There were no vitals taken for this  visit. There were no vitals filed for this visit.  Estimated body mass index is 19.36 kg/m as calculated from the following:   Height as of 07/21/22: '5\' 6"'$  (1.676 m).   Weight as of 07/21/22: 119 lb 14.9 oz (54.4 kg).   PERFORMANCE STATUS (ECOG) : 3 - Symptomatic, >50% confined to bed   IMPRESSION: I spoke with patient and her brother by phone. No acute distress identified. Patient has recent cough and runny nose. Denies fever. Cough is improving. Taking daily steroids. She is starting to have some difficulty with certain food/drink items. Tolerating all medications without difficulty. Brother reports bedsores are improving. Patient does endorse occasional pain.   Symptoms controlled  under current regimen. We discussed medications at length. Does not require daily use of pain medication.   All questions answered and support provided.   I discussed the importance of continued conversation with family and their medical providers regarding overall plan of care and treatment options, ensuring decisions are within the context of the patients values and GOCs.  PLAN: Ongoing goals of care and symptom management support.  Oxycodone 5 mg every 6 hours as needed for pain (does not require daily)  ondansetron 8 mg as needed for nausea Valium 5 mg as needed for anxiety Sorbitol as needed for constipation Baclofen 3 times daily We reviewed at length her medication regimen.  She and family verbalized understanding. I will plan to see patient in 4-6 weeks virtually. Sooner if needed.    Patient expressed understanding and was in agreement with this plan. She also understands that She can call the clinic at any time with any questions, concerns, or complaints.    Any controlled substances utilized were prescribed in the context of palliative care. PDMP has been reviewed.    Time Total: 20 min   Visit consisted of counseling and education dealing with the complex and emotionally intense issues  of symptom management and palliative care in the setting of serious and potentially life-threatening illness.Greater than 50%  of this time was spent counseling and coordinating care related to the above assessment and plan.  Alda Lea, AGPCNP-BC  Palliative Medicine Team/[place]

## 2022-08-21 DIAGNOSIS — Z515 Encounter for palliative care: Secondary | ICD-10-CM

## 2022-08-21 DIAGNOSIS — K5900 Constipation, unspecified: Secondary | ICD-10-CM

## 2022-08-21 DIAGNOSIS — C7931 Secondary malignant neoplasm of brain: Secondary | ICD-10-CM

## 2022-08-21 MED ORDER — PANTOPRAZOLE SODIUM 20 MG PO TBEC: 20.0000 mg | DELAYED_RELEASE_TABLET | Freq: Every day | ORAL | 2 refills | Status: DC

## 2022-08-21 MED ORDER — DEXAMETHASONE 2 MG PO TABS: ORAL_TABLET | ORAL | 1 refills | Status: DC

## 2022-08-30 ENCOUNTER — Other Ambulatory Visit: Payer: Self-pay | Admitting: Nurse Practitioner

## 2022-08-30 DIAGNOSIS — Z515 Encounter for palliative care: Secondary | ICD-10-CM

## 2022-08-30 DIAGNOSIS — C7931 Secondary malignant neoplasm of brain: Secondary | ICD-10-CM

## 2022-08-30 DIAGNOSIS — G1221 Amyotrophic lateral sclerosis: Secondary | ICD-10-CM

## 2022-08-30 DIAGNOSIS — K5900 Constipation, unspecified: Secondary | ICD-10-CM

## 2022-08-30 MED ORDER — DEXAMETHASONE 2 MG PO TABS: ORAL_TABLET | ORAL | 0 refills | Status: DC

## 2022-08-31 ENCOUNTER — Encounter: Payer: Self-pay | Admitting: Oncology

## 2022-09-09 ENCOUNTER — Other Ambulatory Visit: Payer: Self-pay | Admitting: Nurse Practitioner

## 2022-09-09 DIAGNOSIS — C7931 Secondary malignant neoplasm of brain: Secondary | ICD-10-CM

## 2022-09-09 DIAGNOSIS — G893 Neoplasm related pain (acute) (chronic): Secondary | ICD-10-CM

## 2022-09-09 MED ORDER — OXYCODONE HCL 5 MG PO TABS: 5.0000 mg | ORAL_TABLET | Freq: Four times a day (QID) | ORAL | 0 refills | Status: DC | PRN

## 2022-09-14 ENCOUNTER — Encounter: Payer: Self-pay | Admitting: Nurse Practitioner

## 2022-09-14 ENCOUNTER — Inpatient Hospital Stay: Payer: Medicare HMO | Attending: Radiation Oncology | Admitting: Nurse Practitioner

## 2022-09-14 DIAGNOSIS — R630 Anorexia: Secondary | ICD-10-CM | POA: Diagnosis not present

## 2022-09-14 DIAGNOSIS — Z515 Encounter for palliative care: Secondary | ICD-10-CM | POA: Diagnosis not present

## 2022-09-14 DIAGNOSIS — G893 Neoplasm related pain (acute) (chronic): Secondary | ICD-10-CM

## 2022-09-14 DIAGNOSIS — R530 Neoplastic (malignant) related fatigue: Secondary | ICD-10-CM | POA: Diagnosis not present

## 2022-09-14 NOTE — Progress Notes (Signed)
[place]  Telephone:(336) [phone] Fax:(336) [phone]   Name: Melanie Hogan Date: 09/14/2022 MRN: [id number]  DOB: 1970/10/12  Patient Care Team: Melanie Hogan as PCP - General (Family Medicine) Melanie Hogan as Nurse Practitioner (Nurse Practitioner)   I connected with Melanie Hogan on 09/14/22 at 10:30 AM EST by phone and verified that I am speaking with the correct person using two identifiers.   I discussed the limitations, risks, security and privacy concerns of performing an evaluation and management service by telemedicine and the availability of in-person appointments. I also discussed with the patient that there may be a patient responsible charge related to this service. The patient expressed understanding and agreed to proceed.   Other persons participating in the visit and their role in the encounter: Melanie Hogan (brother)    Patient's location: home  Provider's location: [facility] office   Chief Complaint: Symptom Management Follow-Up   INTERVAL HISTORY: [name] is a 52 y.o. female with oncologic medical history including ALS, anxiety, stageIIA melanoma s/p resection at [place]. Recently admitted and found to have multiple round lesions and vasogenic edema, numerous pulmonary nodules concerning for metastatic disease s/p whole brain radiation. Palliative ask to see for symptom management and goals of care.    SOCIAL HISTORY:     reports that she has been smoking cigarettes. She has a 10.00 pack-year smoking history. She has never used smokeless tobacco. She reports that she does not currently use alcohol. She reports that she does not currently use drugs.  ADVANCE DIRECTIVES:  Patient does not have a completed advanced directive however she has documents on hand. She has completed and pending finalization. We will make referral to Social Work. Document reviewed at length.     CODE  STATUS: DNR  PAST MEDICAL HISTORY: Past Medical History:  Diagnosis Date   ALS (amyotrophic lateral sclerosis) (HCC)    Anxiety    At high risk for falls    Depression    Migraines    Tobacco use disorder     ALLERGIES:  is allergic to morphine and riluzole.  MEDICATIONS:  Current Outpatient Medications  Medication Sig Dispense Refill   baclofen (LIORESAL) 10 MG tablet Take 10 mg by mouth 3 (three) times daily.     clotrimazole (MYCELEX) 10 MG troche Take 1 tablet (10 mg total) by mouth 5 (five) times daily. 70 Troche 1   dexamethasone (DECADRON) 2 MG tablet Take 1 tablet twice daily. 30 tablet 0   diazepam (VALIUM) 5 MG tablet Take 1 tablet (5 mg total) by mouth 3 (three) times daily as needed for anxiety. 30 tablet 1   escitalopram (LEXAPRO) 20 MG tablet Take 1 tablet (20 mg total) by mouth at bedtime. 90 tablet 3   levETIRAcetam (KEPPRA) 500 MG tablet Take 1 tablet (500 mg total) by mouth 2 (two) times daily. 60 tablet 2   ondansetron (ZOFRAN) 8 MG tablet TAKE 1 TABLET(8 MG) BY MOUTH EVERY 8 HOURS AS NEEDED FOR NAUSEA OR VOMITING 30 tablet 2   oxyCODONE (OXY IR/ROXICODONE) 5 MG immediate release tablet Take 1 tablet (5 mg total) by mouth every 6 (six) hours as needed for severe pain. 30 tablet 0   pantoprazole (PROTONIX) 20 MG tablet Take 1 tablet (20 mg total) by mouth daily. 30 tablet 2   polyethylene glycol (MIRALAX) 17 g packet Take 17 g by mouth 2 (two) times daily. 14 each 0  senna (SENOKOT) 8.6 MG TABS tablet Take 1 tablet (8.6 mg total) by mouth daily. 120 tablet 0   sorbitol 70 % SOLN Take 30 mLs by mouth daily as needed for moderate constipation or severe constipation. 473 mL 1   traZODone (DESYREL) 150 MG tablet Take 1 tablet (150 mg total) by mouth at bedtime as needed. for sleep 90 tablet 3   No current facility-administered medications for this visit.    VITAL SIGNS: There were no vitals taken for this visit. There were no vitals filed for this visit.   Estimated body mass index is 19.36 kg/m as calculated from the following:   Height as of 07/21/22: '5\' 6"'$  (1.676 m).   Weight as of 07/21/22: 119 lb 14.9 oz (54.4 kg).   PERFORMANCE STATUS (ECOG) : 3 - Symptomatic, >50% confined to bed   IMPRESSION:  I connected with patient and her brother by phone. No acute distress identified. [name] continues to recover from previous cold. She is able to eat. Brother reports they will adjust the consistency of food depending on how she is doing.   [name] shares her pain has increased over the past. She is taking her oxycodone 1- 2 times per day. She is trying to not have to take more often as it sometimes makes her drowsy. She prefers to be alert and awake as much as possible.   Reports some lower extremity weakness. Overall taking it one day at time. Patient and family understands we are available as needed for ongoing support.   I discussed the importance of continued conversation with family and their medical providers regarding overall plan of care and treatment options, ensuring decisions are within the context of the patients values and GOCs.  PLAN: Ongoing goals of care and symptom management support.  Oxycodone 5 mg every 6 hours as needed for pain (taking 1-2 doses/day) Ondansetron 8 mg as needed for nausea Valium 5 mg as needed for anxiety Sorbitol as needed for constipation Baclofen 3 times daily I will plan to see patient in 6-8 weeks virtually. Sooner if needed.    Patient expressed understanding and was in agreement with this plan. She also understands that She can call the clinic at any time with any questions, concerns, or complaints.   Any controlled substances utilized were prescribed in the context of palliative care. PDMP has been reviewed.    Time Total: 30 min   Visit consisted of counseling and education dealing with the complex and emotionally intense issues of symptom management and palliative care in the setting  of serious and potentially life-threatening illness.Greater than 50%  of this time was spent counseling and coordinating care related to the above assessment and plan.  Melanie Hogan, AGPCNP-BC  Palliative Medicine Team/[place]

## 2022-09-16 ENCOUNTER — Telehealth: Payer: Self-pay | Admitting: Radiation Therapy

## 2022-09-16 NOTE — Telephone Encounter (Signed)
I called to check in and see if Melanie Hogan is interested in continuing with MRI surveillance of her brain. The last scan completed in December was very difficult for the patient. At that time she said she was not sure if she wanted to continue follow-ups. Per her brother, Fritz Pickerel, they are not interested in pursuing any future "in person" visits to the hospital unless the patient is symptomatic and in need of medical intervention. They are not interested in surveillance or preventative follow-up imaging or visits of any kind.  I will pass this along to her provider.   Mont Dutton R.T.(R)(T) Radiation Special Procedures Navigator

## 2022-09-23 ENCOUNTER — Other Ambulatory Visit: Payer: Self-pay | Admitting: Nurse Practitioner

## 2022-09-23 DIAGNOSIS — C7931 Secondary malignant neoplasm of brain: Secondary | ICD-10-CM

## 2022-09-23 DIAGNOSIS — R110 Nausea: Secondary | ICD-10-CM

## 2022-09-23 DIAGNOSIS — Z515 Encounter for palliative care: Secondary | ICD-10-CM

## 2022-09-23 MED ORDER — ONDANSETRON HCL 8 MG PO TABS: 8.0000 mg | ORAL_TABLET | Freq: Three times a day (TID) | ORAL | 3 refills | Status: DC | PRN

## 2022-10-03 ENCOUNTER — Encounter: Payer: Self-pay | Admitting: Nurse Practitioner

## 2022-10-03 ENCOUNTER — Inpatient Hospital Stay (HOSPITAL_BASED_OUTPATIENT_CLINIC_OR_DEPARTMENT_OTHER): Payer: Medicare HMO | Admitting: Nurse Practitioner

## 2022-10-03 DIAGNOSIS — Z515 Encounter for palliative care: Secondary | ICD-10-CM

## 2022-10-03 DIAGNOSIS — G1221 Amyotrophic lateral sclerosis: Secondary | ICD-10-CM

## 2022-10-03 DIAGNOSIS — R531 Weakness: Secondary | ICD-10-CM

## 2022-10-03 DIAGNOSIS — C7931 Secondary malignant neoplasm of brain: Secondary | ICD-10-CM

## 2022-10-03 DIAGNOSIS — Z7189 Other specified counseling: Secondary | ICD-10-CM | POA: Diagnosis not present

## 2022-10-03 DIAGNOSIS — G893 Neoplasm related pain (acute) (chronic): Secondary | ICD-10-CM | POA: Diagnosis not present

## 2022-10-03 NOTE — Progress Notes (Signed)
[place]  Telephone:(336) [phone] Fax:(336) [phone]   Name: Melanie Hogan Date: 10/03/2022 MRN: [id number]  DOB: [birth year]  Patient Care Team: Melanie Nutting, DO as PCP - General (Family Medicine) Pickenpack-Cousar, Melanie Sax, NP as Nurse Practitioner (Nurse Practitioner)   I connected with Melanie Hogan on 10/03/22 at 12:30 PM EST by phone and verified that I am speaking with the correct person using two identifiers.   I discussed the limitations, risks, security and privacy concerns of performing an evaluation and management service by telemedicine and the availability of in-person appointments. I also discussed with the patient that there may be a patient responsible charge related to this service. The patient expressed understanding and agreed to proceed.   Other persons participating in the visit and their role in the encounter: Melanie Hogan (brother)    Patient's location: home  Provider's location: [facility] office   Chief Complaint: Symptom Management Follow-Up   INTERVAL HISTORY: Melanie Hogan is a 52 y.o. female with oncologic medical history including ALS, anxiety, stageIIA melanoma s/p resection at [place]. Recently admitted and found to have multiple round lesions and vasogenic edema, numerous pulmonary nodules concerning for metastatic disease s/p whole brain radiation. Palliative ask to see for symptom management and goals of care.    SOCIAL HISTORY:     reports that she has been smoking cigarettes. She has a 10.00 pack-year smoking history. She has never used smokeless tobacco. She reports that she does not currently use alcohol. She reports that she does not currently use drugs.  ADVANCE DIRECTIVES:  Patient does not have a completed advanced directive however she has documents on hand. She has completed and pending finalization. We will make referral to Social Work. Document reviewed at length.     CODE  STATUS: DNR  PAST MEDICAL HISTORY: Past Medical History:  Diagnosis Date   ALS (amyotrophic lateral sclerosis) (HCC)    Anxiety    At high risk for falls    Depression    Migraines    Tobacco use disorder     ALLERGIES:  is allergic to morphine and riluzole.  MEDICATIONS:  Current Outpatient Medications  Medication Sig Dispense Refill   baclofen (LIORESAL) 10 MG tablet Take 10 mg by mouth 3 (three) times daily.     clotrimazole (MYCELEX) 10 MG troche Take 1 tablet (10 mg total) by mouth 5 (five) times daily. 70 Troche 1   dexamethasone (DECADRON) 2 MG tablet Take 1 tablet twice daily. 30 tablet 0   diazepam (VALIUM) 5 MG tablet Take 1 tablet (5 mg total) by mouth 3 (three) times daily as needed for anxiety. 30 tablet 1   escitalopram (LEXAPRO) 20 MG tablet Take 1 tablet (20 mg total) by mouth at bedtime. 90 tablet 3   levETIRAcetam (KEPPRA) 500 MG tablet Take 1 tablet (500 mg total) by mouth 2 (two) times daily. 60 tablet 2   ondansetron (ZOFRAN) 8 MG tablet Take 1 tablet (8 mg total) by mouth every 8 (eight) hours as needed for nausea or vomiting. TAKE 1 TABLET(8 MG) BY MOUTH EVERY 8 HOURS AS NEEDED FOR NAUSEA OR VOMITING 60 tablet 3   oxyCODONE (OXY IR/ROXICODONE) 5 MG immediate release tablet Take 1 tablet (5 mg total) by mouth every 6 (six) hours as needed for severe pain. 30 tablet 0   pantoprazole (PROTONIX) 20 MG tablet Take 1 tablet (20 mg total) by mouth daily. 30 tablet 2   polyethylene glycol (  MIRALAX) 17 g packet Take 17 g by mouth 2 (two) times daily. 14 each 0   senna (SENOKOT) 8.6 MG TABS tablet Take 1 tablet (8.6 mg total) by mouth daily. 120 tablet 0   sorbitol 70 % SOLN Take 30 mLs by mouth daily as needed for moderate constipation or severe constipation. 473 mL 1   traZODone (DESYREL) 150 MG tablet Take 1 tablet (150 mg total) by mouth at bedtime as needed. for sleep 90 tablet 3   No current facility-administered medications for this visit.    VITAL SIGNS: There  were no vitals taken for this visit. There were no vitals filed for this visit.  Estimated body mass index is 19.36 kg/m as calculated from the following:   Height as of 07/21/22: '5\' 6"'$  (1.676 m).   Weight as of 07/21/22: 119 lb 14.9 oz (54.4 kg).   PERFORMANCE STATUS (ECOG) : 3 - Symptomatic, >50% confined to bed   IMPRESSION: I connected with Melanie Hogan (patient's brother) by phone. Unfortunately Melanie Hogan shares patient has shown significant decline over the past 2-3 days. His mother and sisters are with patient as he has not been feeling well and wanted to maintain safe distance. Patient is minimally responsive, changes in breathing pattern, and sleeping more than she is awake. No oral intake.   We discussed at length disease trajectory and signs of end-of-life. Education provided on outpatient hospice support. Family's wishes is for patient to remain at home if possible but is aware of options for residential hospice if needed.   They verbalize understanding. DNR/DNI confirmed. Melanie Hogan confirms mutual family wishes to focus on Melanie Hogan comfort and for hospice referral. Education provided on referral process. We will try to get a hospice nurse out to them today if possible give significant decline.   Family verbalizes appreciation of support and all care received.    PLAN: Hospice referral to [facility] as requested by family.  Prognosis weeks to days in the setting of advanced ALS and metastatic melanoma with brain involvement, poor oral nutrition, deconditioning, and change in mentation.  Education provided and family clear on wishes to focus on Melanie Hogan comfort.  No follow-up at this time.    Patient expressed understanding and was in agreement with this plan. She also understands that She can call the clinic at any time with any questions, concerns, or complaints.   Time Total: 45 min.   Visit consisted of counseling and education dealing with the complex and emotionally intense  issues of symptom management and palliative care in the setting of serious and potentially life-threatening illness.Greater than 50%  of this time was spent counseling and coordinating care related to the above assessment and plan.  Alda Lea, AGPCNP-BC  Palliative Medicine Team/ [place]

## 2022-10-06 ENCOUNTER — Other Ambulatory Visit: Payer: Self-pay

## 2022-10-06 DIAGNOSIS — G893 Neoplasm related pain (acute) (chronic): Secondary | ICD-10-CM

## 2022-10-06 DIAGNOSIS — C7931 Secondary malignant neoplasm of brain: Secondary | ICD-10-CM

## 2022-10-06 MED ORDER — OXYCODONE HCL 5 MG PO TABS: 5.0000 mg | ORAL_TABLET | ORAL | 0 refills | Status: DC | PRN

## 2022-10-06 NOTE — Telephone Encounter (Signed)
Received call from Evie Lacks, RN from [facility] regarding increased pt pain level and adjusting her medications. New ordered sent and julie, RN notified.

## 2022-10-11 ENCOUNTER — Other Ambulatory Visit: Payer: Self-pay | Admitting: Nurse Practitioner

## 2022-10-11 DIAGNOSIS — Z515 Encounter for palliative care: Secondary | ICD-10-CM

## 2022-10-11 DIAGNOSIS — C7931 Secondary malignant neoplasm of brain: Secondary | ICD-10-CM

## 2022-10-11 DIAGNOSIS — M792 Neuralgia and neuritis, unspecified: Secondary | ICD-10-CM

## 2022-10-11 DIAGNOSIS — G43911 Migraine, unspecified, intractable, with status migrainosus: Secondary | ICD-10-CM

## 2022-10-12 ENCOUNTER — Other Ambulatory Visit: Payer: Self-pay

## 2022-10-12 DIAGNOSIS — Z515 Encounter for palliative care: Secondary | ICD-10-CM

## 2022-10-12 DIAGNOSIS — C7931 Secondary malignant neoplasm of brain: Secondary | ICD-10-CM

## 2022-10-12 DIAGNOSIS — G1221 Amyotrophic lateral sclerosis: Secondary | ICD-10-CM

## 2022-10-12 MED ORDER — GABAPENTIN 300 MG PO CAPS: 300.0000 mg | ORAL_CAPSULE | Freq: Every day | ORAL | 0 refills | Status: DC

## 2022-10-12 MED ORDER — DEXAMETHASONE 4 MG PO TABS: 4.0000 mg | ORAL_TABLET | Freq: Two times a day (BID) | ORAL | 0 refills | Status: DC

## 2022-10-12 NOTE — Telephone Encounter (Signed)
Spoke with Evie Lacks, RN from [facility], pt c/o increased pain and neuropathy, orders sent in per Lexine Baton, NP

## 2022-10-17 ENCOUNTER — Telehealth: Payer: Self-pay

## 2022-10-17 NOTE — Telephone Encounter (Signed)
[facility] RN called and asked for an increase in dex to 3x a day, per Lexine Baton, NP ok for dose increase [facility] RN notified.

## 2022-11-01 ENCOUNTER — Telehealth: Payer: Medicare HMO

## 2022-12-07 DEATH — deceased
